# Patient Record
Sex: Female | Born: 1990 | Race: White | Hispanic: No | Marital: Married | Smoking: Never smoker
Health system: Southern US, Community
[De-identification: ages and names within clinical notes are randomized; demographics above are authoritative.]

## PROBLEM LIST (undated history)

## (undated) DIAGNOSIS — O24419 Gestational diabetes mellitus in pregnancy, unspecified control: Secondary | ICD-10-CM

## (undated) DIAGNOSIS — E282 Polycystic ovarian syndrome: Secondary | ICD-10-CM

## (undated) DIAGNOSIS — K760 Fatty (change of) liver, not elsewhere classified: Secondary | ICD-10-CM

## (undated) DIAGNOSIS — I10 Essential (primary) hypertension: Secondary | ICD-10-CM

## (undated) DIAGNOSIS — E119 Type 2 diabetes mellitus without complications: Secondary | ICD-10-CM

---
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
## 2013-04-24 HISTORY — PX: CHOLECYSTECTOMY: SHX55

## 2017-04-24 DIAGNOSIS — I10 Essential (primary) hypertension: Secondary | ICD-10-CM

## 2017-04-24 DIAGNOSIS — E88819 Insulin resistance, unspecified: Secondary | ICD-10-CM | POA: Insufficient documentation

## 2017-04-24 DIAGNOSIS — E8881 Metabolic syndrome: Secondary | ICD-10-CM | POA: Insufficient documentation

## 2017-04-24 DIAGNOSIS — Z8759 Personal history of other complications of pregnancy, childbirth and the puerperium: Secondary | ICD-10-CM | POA: Insufficient documentation

## 2017-04-24 DIAGNOSIS — E282 Polycystic ovarian syndrome: Secondary | ICD-10-CM | POA: Insufficient documentation

## 2017-04-24 HISTORY — DX: Essential (primary) hypertension: I10

## 2017-09-25 DIAGNOSIS — R748 Abnormal levels of other serum enzymes: Secondary | ICD-10-CM | POA: Insufficient documentation

## 2017-11-20 DIAGNOSIS — K760 Fatty (change of) liver, not elsewhere classified: Secondary | ICD-10-CM | POA: Insufficient documentation

## 2018-02-18 DIAGNOSIS — O24410 Gestational diabetes mellitus in pregnancy, diet controlled: Secondary | ICD-10-CM

## 2018-02-18 HISTORY — DX: Gestational diabetes mellitus in pregnancy, diet controlled: O24.410

## 2018-02-25 DIAGNOSIS — O1003 Pre-existing essential hypertension complicating the puerperium: Secondary | ICD-10-CM | POA: Insufficient documentation

## 2018-02-25 HISTORY — DX: Pre-existing essential hypertension complicating the puerperium: O10.03

## 2018-03-19 DIAGNOSIS — Z8632 Personal history of gestational diabetes: Secondary | ICD-10-CM | POA: Insufficient documentation

## 2019-02-20 DIAGNOSIS — F419 Anxiety disorder, unspecified: Secondary | ICD-10-CM | POA: Insufficient documentation

## 2020-05-11 ENCOUNTER — Inpatient Hospital Stay (HOSPITAL_COMMUNITY)
Admission: AD | Admit: 2020-05-11 | Discharge: 2020-05-11 | Disposition: A | Discharge status: Home / Self Care | Payer: 59 | Attending: Obstetrics and Gynecology | Admitting: Obstetrics and Gynecology

## 2020-05-11 ENCOUNTER — Encounter (HOSPITAL_COMMUNITY): Payer: Self-pay | Admitting: Obstetrics and Gynecology

## 2020-05-11 ENCOUNTER — Inpatient Hospital Stay (HOSPITAL_COMMUNITY): Payer: 59

## 2020-05-11 DIAGNOSIS — R079 Chest pain, unspecified: Secondary | ICD-10-CM

## 2020-05-11 DIAGNOSIS — O26612 Liver and biliary tract disorders in pregnancy, second trimester: Secondary | ICD-10-CM | POA: Diagnosis not present

## 2020-05-11 DIAGNOSIS — Z3A17 17 weeks gestation of pregnancy: Secondary | ICD-10-CM

## 2020-05-11 DIAGNOSIS — O98512 Other viral diseases complicating pregnancy, second trimester: Secondary | ICD-10-CM | POA: Diagnosis not present

## 2020-05-11 DIAGNOSIS — U071 COVID-19: Secondary | ICD-10-CM | POA: Diagnosis not present

## 2020-05-11 DIAGNOSIS — R091 Pleurisy: Secondary | ICD-10-CM | POA: Diagnosis present

## 2020-05-11 DIAGNOSIS — K760 Fatty (change of) liver, not elsewhere classified: Secondary | ICD-10-CM | POA: Diagnosis not present

## 2020-05-11 DIAGNOSIS — O10012 Pre-existing essential hypertension complicating pregnancy, second trimester: Secondary | ICD-10-CM | POA: Insufficient documentation

## 2020-05-11 HISTORY — DX: Fatty (change of) liver, not elsewhere classified: K76.0

## 2020-05-11 HISTORY — DX: Polycystic ovarian syndrome: E28.2

## 2020-05-11 LAB — CBC WITH DIFFERENTIAL/PLATELET
Abs Immature Granulocytes: 0.02 10*3/uL (ref 0.00–0.07)
Basophils Absolute: 0 10*3/uL (ref 0.0–0.1)
Basophils Relative: 1 %
Eosinophils Absolute: 0.3 10*3/uL (ref 0.0–0.5)
Eosinophils Relative: 5 %
HCT: 37.4 % (ref 36.0–46.0)
Hemoglobin: 12.7 g/dL (ref 12.0–15.0)
Immature Granulocytes: 0 %
Lymphocytes Relative: 26 %
Lymphs Abs: 1.8 10*3/uL (ref 0.7–4.0)
MCH: 32.2 pg (ref 26.0–34.0)
MCHC: 34 g/dL (ref 30.0–36.0)
MCV: 94.7 fL (ref 80.0–100.0)
Monocytes Absolute: 0.7 10*3/uL (ref 0.1–1.0)
Monocytes Relative: 11 %
Neutro Abs: 4 10*3/uL (ref 1.7–7.7)
Neutrophils Relative %: 57 %
Platelets: 163 10*3/uL (ref 150–400)
RBC: 3.95 MIL/uL (ref 3.87–5.11)
RDW: 13.3 % (ref 11.5–15.5)
WBC: 6.9 10*3/uL (ref 4.0–10.5)
nRBC: 0 % (ref 0.0–0.2)

## 2020-05-11 LAB — URINALYSIS, ROUTINE W REFLEX MICROSCOPIC
Bilirubin Urine: NEGATIVE
Glucose, UA: NEGATIVE mg/dL
Hgb urine dipstick: NEGATIVE
Ketones, ur: NEGATIVE mg/dL
Leukocytes,Ua: NEGATIVE
Nitrite: NEGATIVE
Protein, ur: NEGATIVE mg/dL
Specific Gravity, Urine: 1.02 (ref 1.005–1.030)
pH: 6 (ref 5.0–8.0)

## 2020-05-11 LAB — COMPREHENSIVE METABOLIC PANEL
ALT: 42 U/L (ref 0–44)
AST: 27 U/L (ref 15–41)
Albumin: 3.1 g/dL — ABNORMAL LOW (ref 3.5–5.0)
Alkaline Phosphatase: 62 U/L (ref 38–126)
Anion gap: 11 (ref 5–15)
BUN: 6 mg/dL (ref 6–20)
CO2: 22 mmol/L (ref 22–32)
Calcium: 9 mg/dL (ref 8.9–10.3)
Chloride: 105 mmol/L (ref 98–111)
Creatinine, Ser: 0.48 mg/dL (ref 0.44–1.00)
GFR, Estimated: >60 mL/min (ref >60)
Glucose, Bld: 102 mg/dL — ABNORMAL HIGH (ref 70–99)
Potassium: 3.8 mmol/L (ref 3.5–5.1)
Sodium: 138 mmol/L (ref 135–145)
Total Bilirubin: 0.5 mg/dL (ref 0.3–1.2)
Total Protein: 5.9 g/dL — ABNORMAL LOW (ref 6.5–8.1)

## 2020-05-11 LAB — RESP PANEL BY RT-PCR (FLU A&B, COVID) ARPGX2
Influenza A by PCR: NEGATIVE
Influenza B by PCR: NEGATIVE
SARS Coronavirus 2 by RT PCR: POSITIVE — AB

## 2020-05-11 MED ORDER — BENZONATATE 100 MG PO CAPS: 200.0000 mg | ORAL_CAPSULE | Freq: Three times a day (TID) | ORAL | 0 refills | Status: DC | PRN

## 2020-05-11 NOTE — MAU Note (Addendum)
Patient states she has been feeling bad since last week (congestion, body aches, chills, fevers & cough & chest pain associated w/ movement and talking).  Patient had a positive home Covid test but the OB told her they thought it could be Flu.  Reports she has a history of double pneumonia in 2020 when she was positive for Covid.  Patient denies any known Covid/Flu contacts.  Denies any VB/LOF.  Patient states her due date is 10/17/20 based off U/S in the office.

## 2020-05-11 NOTE — Discharge Instructions (Signed)
COVID-19: What Your Test Results Mean If you test positive for COVID-19 Take steps to protect others regardless of your COVID-19 vaccination status Stay home.  Isolate at home for at least 10 days. Stay in a specific room and away from other people in your home. Get rest and stay hydrated. If you develop symptoms, continue to isolate for at least 10 days after symptoms began and until you do not have a fever without using medications to reduce fever. Stay in touch with your doctor. Contact your doctor as soon as possible if you are an older adult or have underlying medical conditions. Contact your doctor or health department about isolation if you  Are severely ill or have a weakened immune system.  Had a positive test result followed by a negative result.  Test positive for many weeks. If you test negative for COVID-19:  The virus was not detected. If you have symptoms of COVID-19:  You may have received a false negative test result and still might have COVID-19.  Isolate from others. If you do not have symptoms of COVID-19 and you were exposed to a person with COVID-19:  You are likely not infected, but you still may get sick.  Contact your doctor about your symptoms, about follow-up testing, and how long to isolate.  Self-quarantine for 14 days at home after your exposure.  If you are fully vaccinated, you do not need to self quarantine.  Contact your doctor or local health department regarding options to reduce the length of your quarantine. A negative test result does not mean you won't get sick later. [email or web address] 01/20/2020 This information is not intended to replace advice given to you by your health care provider. Make sure you discuss any questions you have with your health care provider. Document Revised: 02/23/2020 Document Reviewed: 02/23/2020 Elsevier Patient Education  [place] Medications in Pregnancy   Acne:  Benzoyl Peroxide   Salicylic Acid   Backache/Headache:  Tylenol: 2 regular strength every 4 hours OR        2 Extra strength every 6 hours   Colds/Coughs/Allergies:  Benadryl (alcohol free) 25 mg every 6 hours as needed  Breath right strips  Claritin  Cepacol throat lozenges  Chloraseptic throat spray  Cold-Eeze- up to three times per day  Cough drops, alcohol free  Flonase (by prescription only)  Guaifenesin  Mucinex  Robitussin DM (plain only, alcohol free)  Saline nasal spray/drops  Sudafed (pseudoephedrine) & Actifed * use only after [redacted] weeks gestation and if you do not have high blood pressure  Tylenol  Vicks Vaporub  Zinc lozenges  Zyrtec   Constipation:  Colace  Ducolax suppositories  Fleet enema  Glycerin suppositories  Metamucil  Milk of magnesia  Miralax  Senokot  Smooth move tea   Diarrhea:  Kaopectate  Imodium A-D   *NO pepto Bismol   Hemorrhoids:  Anusol  Anusol HC  Preparation H  Tucks   Indigestion:  Tums  Maalox  Mylanta  Zantac  Pepcid   Insomnia:  Benadryl (alcohol free) 25mg  every 6 hours as needed  Tylenol PM  Unisom, no Gelcaps   Leg Cramps:  Tums  MagGel   Nausea/Vomiting:  Bonine  Dramamine  Emetrol  Ginger extract  Sea bands  Meclizine  Nausea medication to take during pregnancy:  Unisom (doxylamine succinate 25 mg tablets) Take one tablet daily at bedtime. If symptoms are not adequately controlled, the dose can be increased to a maximum recommended dose  of two tablets daily (1/2 tablet in the morning, 1/2 tablet mid-afternoon and one at bedtime).  Vitamin B6 100mg  tablets. Take one tablet twice a day (up to 200 mg per day).   Skin Rashes:  Aveeno products  Benadryl cream or 25mg  every 6 hours as needed  Calamine Lotion  1% cortisone cream   Yeast infection:  Gyne-lotrimin 7  Monistat 7    **If taking multiple medications, please check labels to avoid duplicating the same active ingredients  **take medication as  directed on the label  ** Do not exceed 4000 mg of tylenol in 24 hours  **Do not take medications that contain aspirin or ibuprofen

## 2020-05-11 NOTE — MAU Provider Note (Signed)
History     CSN: [id number]  Arrival date and time: 05/11/20 1745   Event Date/Time   First Provider Initiated Contact with Patient 05/11/20 1857      Chief Complaint  Patient presents with  . Pleurisy   HPI  [name] is a 30 y.o. female G3P1011 @ [redacted]w[redacted]d here in MAU with chest pain; sent here by [facility] for possible pneumonia. She reports 1 week ago she started having itchy ears, throat, nose with runny nose. The next day she took allergy medicine and the symptoms went away. Sunday (1/16) she had body aches, fatigue, chills and shivers. Monday she had worsening symptoms including cough and her home Covid test was positive. Today she reports a worsening cough and reports some chest pain with exertion and mobility.  She had Covid pneumonia in 2020 and is worried she is going to get sick again. She reports the pain is similar in her chest to that, however not has worse as it was previously with Covid.  She is taking Theraflu which helped her fever. She has taken some tylenol although limited due to her fatty liver.    She has not gotten her Covid vaccine and does not plan to get it.  CHTN: has not been able to keep down her procardia daily.   OB History    Gravida  3   Para  1   Term  1   Preterm      AB  1   Living  1     SAB  1   IAB      Ectopic      Multiple      Live Births  1           Past Medical History:  Diagnosis Date  . Fatty liver   . PCOS (polycystic ovarian syndrome)     Past Surgical History:  Procedure Laterality Date  . CHOLECYSTECTOMY  2015  . WISDOM TOOTH EXTRACTION  2011    History reviewed. No pertinent family history.  Social History   Tobacco Use  . Smoking status: Never Smoker  . Smokeless tobacco: Never Used  Vaping Use  . Vaping Use: Never used  Substance Use Topics  . Alcohol use: Never  . Drug use: Never    Allergies: No Known Allergies  Medications Prior to Admission  Medication Sig Dispense Refill Last Dose   . metFORMIN (GLUCOPHAGE-XR) 750 MG 24 hr tablet Take 750 mg by mouth daily with breakfast.   Past Week at Unknown time  . NIFEdipine (ADALAT CC) 60 MG 24 hr tablet Take 60 mg by mouth daily.   Past Week at Unknown time  . zinc gluconate 50 MG tablet Take 50 mg by mouth daily.   05/11/2020 at Unknown time   Recent Results (from the past 2160 hour(s))  Resp Panel by RT-PCR (Flu A&B, Covid) Nasopharyngeal Swab     Status: Abnormal   Collection Time: 05/11/20  6:34 PM   Specimen: Nasopharyngeal Swab; Nasopharyngeal(NP) swabs in vial transport medium  Result Value Ref Range   SARS Coronavirus 2 by RT PCR POSITIVE (A) NEGATIVE    Comment: RESULT CALLED TO, READ BACK BY AND VERIFIED WITHTonna Corner RN 2045 05/11/20 A BROWNING (NOTE) SARS-CoV-2 target nucleic acids are DETECTED.  The SARS-CoV-2 RNA is generally detectable in upper respiratory specimens during the acute phase of infection. Positive results are indicative of the presence of the identified virus, but do not rule out bacterial infection or  co-infection with other pathogens not detected by the test. Clinical correlation with patient history and other diagnostic information is necessary to determine patient infection status. The expected result is Negative.  Fact Sheet for Patients: EntrepreneurPulse.com.au  Fact Sheet for Healthcare Providers: IncredibleEmployment.be  This test is not yet approved or cleared by the [place] FDA and  has been authorized for detection and/or diagnosis of SARS-CoV-2 by FDA under an Emergency Use Authorization (EUA).  This EUA will remain in effect (meaning this test can  be used) for the duration of  the COVID-19 declaration under Section 564(b)(1) of the Act, 21 U.S.C. section 360bbb-3(b)(1), unless the authorization is terminated or revoked sooner.     Influenza A by PCR NEGATIVE NEGATIVE   Influenza B by PCR NEGATIVE NEGATIVE    Comment: (NOTE) The  Xpert Xpress SARS-CoV-2/FLU/RSV plus assay is intended as an aid in the diagnosis of influenza from Nasopharyngeal swab specimens and should not be used as a sole basis for treatment. Nasal washings and aspirates are unacceptable for Xpert Xpress SARS-CoV-2/FLU/RSV testing.  Fact Sheet for Patients: EntrepreneurPulse.com.au  Fact Sheet for Healthcare Providers: IncredibleEmployment.be  This test is not yet approved or cleared by the [place] FDA and has been authorized for detection and/or diagnosis of SARS-CoV-2 by FDA under an Emergency Use Authorization (EUA). This EUA will remain in effect (meaning this test can be used) for the duration of the COVID-19 declaration under Section 564(b)(1) of the Act, 21 U.S.C. section 360bbb-3(b)(1), unless the authorization is terminated or revoked.  Performed at [facility], [place] [address] [place], [place] [address]   Urinalysis, Routine w reflex microscopic Urine, Clean Catch     Status: None   Collection Time: 05/11/20  6:45 PM  Result Value Ref Range   Color, Urine YELLOW YELLOW   APPearance CLEAR CLEAR   Specific Gravity, Urine 1.020 1.005 - 1.030   pH 6.0 5.0 - 8.0   Glucose, UA NEGATIVE NEGATIVE mg/dL   Hgb urine dipstick NEGATIVE NEGATIVE   Bilirubin Urine NEGATIVE NEGATIVE   Ketones, ur NEGATIVE NEGATIVE mg/dL   Protein, ur NEGATIVE NEGATIVE mg/dL   Nitrite NEGATIVE NEGATIVE   Leukocytes,Ua NEGATIVE NEGATIVE    Comment: Performed at [facility] [address] [place],  [address]  CBC with Differential/Platelet     Status: None   Collection Time: 05/11/20  7:52 PM  Result Value Ref Range   WBC 6.9 4.0 - 10.5 K/uL   RBC 3.95 3.87 - 5.11 MIL/uL   Hemoglobin 12.7 12.0 - 15.0 g/dL   HCT 37.4 36.0 - 46.0 %   MCV 94.7 80.0 - 100.0 fL   MCH 32.2 26.0 - 34.0 pg   MCHC 34.0 30.0 - 36.0 g/dL   RDW 13.3 11.5 - 15.5 %   Platelets 163 150 - 400 K/uL   nRBC 0.0 0.0 - 0.2  %   Neutrophils Relative % 57 %   Neutro Abs 4.0 1.7 - 7.7 K/uL   Lymphocytes Relative 26 %   Lymphs Abs 1.8 0.7 - 4.0 K/uL   Monocytes Relative 11 %   Monocytes Absolute 0.7 0.1 - 1.0 K/uL   Eosinophils Relative 5 %   Eosinophils Absolute 0.3 0.0 - 0.5 K/uL   Basophils Relative 1 %   Basophils Absolute 0.0 0.0 - 0.1 K/uL   Immature Granulocytes 0 %   Abs Immature Granulocytes 0.02 0.00 - 0.07 K/uL    Comment: Performed at [facility],  [address], [place], [place] [address]   DG Chest 1 View  Result Date: 05/11/2020 CLINICAL DATA:  Chest pain EXAM: CHEST  1 VIEW COMPARISON:  None. FINDINGS: The heart size and mediastinal contours are within normal limits. Both lungs are clear. The visualized skeletal structures are unremarkable. IMPRESSION: No active disease. Electronically Signed   By: Prudencio Pair M.D.   On: 05/11/2020 20:01   Review of Systems  Constitutional: Negative for fever.  Respiratory: Negative for shortness of breath.   Cardiovascular: Positive for chest pain (with movement only, or coughing ). Negative for leg swelling.  Musculoskeletal: Positive for back pain. Negative for myalgias.  Neurological: Negative for headaches.   Physical Exam   Blood pressure (!) 145/86, pulse (!) 110, temperature 98.5 F (36.9 C), resp. rate 19, weight 115.9 kg, SpO2 98 %.  Physical Exam Constitutional:      General: She is not in acute distress.    Appearance: Normal appearance. She is obese. She is not ill-appearing, toxic-appearing or diaphoretic.  HENT:     Head: Normocephalic.  Cardiovascular:     Rate and Rhythm: Regular rhythm. Tachycardia present.     Pulses: Normal pulses.  Pulmonary:     Effort: Pulmonary effort is normal. No respiratory distress.     Breath sounds: Normal breath sounds. No wheezing.  Chest:     Chest wall: No tenderness.  Skin:    General: Skin is warm.  Neurological:     Mental Status: She is alert and oriented to person, place, and  time.  Psychiatric:        Behavior: Behavior normal.    MAU Course  Procedures  MDM  + fetal heart tones via doppler Patient tolerating oral fluids in MAU- denies nausea medication. EKG: normal sinus rhythm Chest Xray- WNL UA without signs of dehydration  Discussed chest xray and labs in detail with the patient; she appears well without distress. A message was sent to the Covid infusion center- patient was informed she may get a call and may be offered MAB infusion Reviewed case with Dr. Dione Plover.   Assessment and Plan   A:  1. COVID-19 affecting pregnancy in second trimester   2. Chest pain   3. [redacted] weeks gestation of pregnancy     P:  Discharge home in stable condition List of safe medications provided to patient Rx: Tessalon Perles Return to MAU with worsening chest pain or SOB Oral hydration is important  Start OTC Flonase Ok to use tylenol as directed on the bottle.   Lezlie Lye, NP 05/11/2020 9:31 PM

## 2020-05-28 ENCOUNTER — Emergency Department (HOSPITAL_BASED_OUTPATIENT_CLINIC_OR_DEPARTMENT_OTHER)
Admission: EM | Admit: 2020-05-28 | Discharge: 2020-05-29 | Disposition: A | Discharge status: Home / Self Care | Payer: 59 | Attending: Emergency Medicine | Admitting: Emergency Medicine

## 2020-05-28 ENCOUNTER — Other Ambulatory Visit: Payer: Self-pay

## 2020-05-28 ENCOUNTER — Encounter (HOSPITAL_BASED_OUTPATIENT_CLINIC_OR_DEPARTMENT_OTHER): Payer: Self-pay | Admitting: *Deleted

## 2020-05-28 DIAGNOSIS — R109 Unspecified abdominal pain: Secondary | ICD-10-CM | POA: Insufficient documentation

## 2020-05-28 DIAGNOSIS — Z3A20 20 weeks gestation of pregnancy: Secondary | ICD-10-CM | POA: Diagnosis not present

## 2020-05-28 DIAGNOSIS — O26892 Other specified pregnancy related conditions, second trimester: Secondary | ICD-10-CM

## 2020-05-28 LAB — CBC WITH DIFFERENTIAL/PLATELET
Abs Immature Granulocytes: 0.04 10*3/uL (ref 0.00–0.07)
Basophils Absolute: 0 10*3/uL (ref 0.0–0.1)
Basophils Relative: 0 %
Eosinophils Absolute: 0.2 10*3/uL (ref 0.0–0.5)
Eosinophils Relative: 2 %
HCT: 37.5 % (ref 36.0–46.0)
Hemoglobin: 12.9 g/dL (ref 12.0–15.0)
Immature Granulocytes: 0 %
Lymphocytes Relative: 27 %
Lymphs Abs: 2.5 10*3/uL (ref 0.7–4.0)
MCH: 32.5 pg (ref 26.0–34.0)
MCHC: 34.4 g/dL (ref 30.0–36.0)
MCV: 94.5 fL (ref 80.0–100.0)
Monocytes Absolute: 0.7 10*3/uL (ref 0.1–1.0)
Monocytes Relative: 7 %
Neutro Abs: 5.9 10*3/uL (ref 1.7–7.7)
Neutrophils Relative %: 64 %
Platelets: 186 10*3/uL (ref 150–400)
RBC: 3.97 MIL/uL (ref 3.87–5.11)
RDW: 13.8 % (ref 11.5–15.5)
WBC: 9.3 10*3/uL (ref 4.0–10.5)
nRBC: 0 % (ref 0.0–0.2)

## 2020-05-28 LAB — URINALYSIS, MICROSCOPIC (REFLEX)

## 2020-05-28 LAB — URINALYSIS, ROUTINE W REFLEX MICROSCOPIC
Bilirubin Urine: NEGATIVE
Glucose, UA: NEGATIVE mg/dL
Hgb urine dipstick: NEGATIVE
Leukocytes,Ua: NEGATIVE
Nitrite: NEGATIVE
Specific Gravity, Urine: 1.03 (ref 1.005–1.030)
pH: 6 (ref 5.0–8.0)

## 2020-05-28 LAB — COMPREHENSIVE METABOLIC PANEL
ALT: 33 U/L (ref 0–44)
AST: 22 U/L (ref 15–41)
Albumin: 3.3 g/dL — ABNORMAL LOW (ref 3.5–5.0)
Alkaline Phosphatase: 61 U/L (ref 38–126)
Anion gap: 12 (ref 5–15)
BUN: 10 mg/dL (ref 6–20)
CO2: 21 mmol/L — ABNORMAL LOW (ref 22–32)
Calcium: 9.4 mg/dL (ref 8.9–10.3)
Chloride: 104 mmol/L (ref 98–111)
Creatinine, Ser: 0.59 mg/dL (ref 0.44–1.00)
GFR, Estimated: >60 mL/min (ref >60)
Glucose, Bld: 97 mg/dL (ref 70–99)
Potassium: 3.7 mmol/L (ref 3.5–5.1)
Sodium: 137 mmol/L (ref 135–145)
Total Bilirubin: 0.4 mg/dL (ref 0.3–1.2)
Total Protein: 6.9 g/dL (ref 6.5–8.1)

## 2020-05-28 MED ORDER — MORPHINE SULFATE (PF) 4 MG/ML IV SOLN
4.0000 mg | Freq: Once | INTRAVENOUS | Status: AC
  Administered 2020-05-28: 4 mg via INTRAVENOUS
  Filled 2020-05-28: qty 1

## 2020-05-28 MED ORDER — ONDANSETRON HCL 4 MG/2ML IJ SOLN
4.0000 mg | Freq: Once | INTRAMUSCULAR | Status: AC
  Administered 2020-05-28: 4 mg via INTRAVENOUS
  Filled 2020-05-28: qty 2

## 2020-05-28 NOTE — ED Provider Notes (Signed)
[facility] Provider Note   CSN: [id number] Arrival date & time: 05/28/20  1935     History Chief Complaint  Patient presents with  . Abdominal Pain    Sheryl Velasquez is a 30 y.o. female.  Patient presents emergency department for evaluation of right sided abdominal pain.  She has been having pain for about a week it was intermittent and more mild.  She thought that she had round ligament pain.  She is approximately [redacted] weeks pregnant.  She spoke with her OB/GYN office and they recommended coming in for evaluation if she had pain in her back.  Pain became more severe tonight.  She has been having vomiting with this severe pain.  No hematuria or dysuria.  She denies any change in vaginal discharge or vaginal bleeding.  No fevers, chest pain, shortness of breath.        Past Medical History:  Diagnosis Date  . Fatty liver   . PCOS (polycystic ovarian syndrome)     There are no problems to display for this patient.   Past Surgical History:  Procedure Laterality Date  . CHOLECYSTECTOMY  2015  . WISDOM TOOTH EXTRACTION  2011     OB History    Gravida  3   Para  1   Term  1   Preterm      AB  1   Living  1     SAB  1   IAB      Ectopic      Multiple      Live Births  1           No family history on file.  Social History   Tobacco Use  . Smoking status: Never Smoker  . Smokeless tobacco: Never Used  Vaping Use  . Vaping Use: Never used  Substance Use Topics  . Alcohol use: Never  . Drug use: Never    Home Medications Prior to Admission medications   Medication Sig Start Date End Date Taking? Authorizing Provider  metFORMIN (GLUCOPHAGE-XR) 750 MG 24 hr tablet Take 750 mg by mouth daily with breakfast.   Yes [provider]  NIFEdipine (ADALAT CC) 60 MG 24 hr tablet Take 60 mg by mouth daily.   Yes [provider]  zinc gluconate 50 MG tablet Take 50 mg by mouth daily.   Yes [provider]  benzonatate (TESSALON PERLES) 100 MG capsule Take 2 capsules (200 mg total) by mouth 3 (three) times daily as needed for cough. 05/11/20 05/11/21  Rasch, Artist Pais, NP    Allergies    Patient has no known allergies.  Review of Systems   Review of Systems  Constitutional: Negative for fever.  HENT: Negative for rhinorrhea and sore throat.   Eyes: Negative for redness.  Respiratory: Negative for cough.   Cardiovascular: Negative for chest pain.  Gastrointestinal: Positive for abdominal pain, nausea and vomiting. Negative for diarrhea.  Genitourinary: Negative for dysuria, frequency, hematuria, urgency, vaginal bleeding and vaginal discharge.  Musculoskeletal: Negative for myalgias.  Skin: Negative for rash.  Neurological: Negative for headaches.    Physical Exam Updated Vital Signs BP 117/69 (BP Location: Right Arm)   Pulse 95   Temp 98.3 F (36.8 C) (Oral)   Resp 18   Ht 5\' 2"  (1.575 m)   Wt 116.1 kg   SpO2 100%   BMI 46.82 kg/m   Physical Exam Vitals and nursing note reviewed.  Constitutional:  General: She is in acute distress.     Appearance: She is well-developed.     Comments: Patient actively heaving and vomiting.  HENT:     Head: Normocephalic and atraumatic.     Right Ear: External ear normal.     Left Ear: External ear normal.     Nose: Nose normal.  Eyes:     Conjunctiva/sclera: Conjunctivae normal.  Cardiovascular:     Rate and Rhythm: Normal rate and regular rhythm.     Heart sounds: No murmur heard.   Pulmonary:     Effort: No respiratory distress.     Breath sounds: No wheezing, rhonchi or rales.  Abdominal:     Palpations: Abdomen is soft.     Tenderness: There is no abdominal tenderness (Abdomen exam performed after administration of medications, pain resolved). There is no guarding or rebound.  Musculoskeletal:     Cervical back: Normal range of motion and neck supple.     Right lower leg: No edema.     Left lower leg: No  edema.  Skin:    General: Skin is warm and dry.     Findings: No rash.  Neurological:     General: No focal deficit present.     Mental Status: She is alert. Mental status is at baseline.     Motor: No weakness.  Psychiatric:        Mood and Affect: Mood normal.     ED Results / Procedures / Treatments   Labs (all labs ordered are listed, but only abnormal results are displayed) Labs Reviewed  URINALYSIS, ROUTINE W REFLEX MICROSCOPIC - Abnormal; Notable for the following components:      Result Value   APPearance HAZY (*)    Ketones, ur TRACE (*)    Protein, ur TRACE (*)    All other components within normal limits  URINALYSIS, MICROSCOPIC (REFLEX) - Abnormal; Notable for the following components:   Bacteria, UA MANY (*)    All other components within normal limits  COMPREHENSIVE METABOLIC PANEL - Abnormal; Notable for the following components:   CO2 21 (*)    Albumin 3.3 (*)    All other components within normal limits  CBC WITH DIFFERENTIAL/PLATELET    EKG None  Radiology No results found.  Procedures Procedures   Medications Ordered in ED Medications  morphine 4 MG/ML injection 4 mg (4 mg Intravenous Given 05/28/20 2126)  ondansetron (ZOFRAN) injection 4 mg (4 mg Intravenous Given 05/28/20 2124)    ED Course  I have reviewed the triage vital signs and the nursing notes.  Pertinent labs & imaging results that were available during my care of the patient were reviewed by me and considered in my medical decision making (see chart for details).  Upon entering the room, patient is actively vomiting in bed, on all fours.  Most of history from patient's husband, but she denies current vaginal bleeding or discharge.  Pain is right lower quadrant and back.  She is going to need medication for pain if she appears extremely uncomfortable.  Will give morphine and Zofran to try to control symptoms in order to get a good exam.  Vital signs reviewed and are as follows: BP  117/69 (BP Location: Right Arm)   Pulse 95   Temp 98.3 F (36.8 C) (Oral)   Resp 18   Ht 5\' 2"  (1.575 m)   Wt 116.1 kg   SpO2 100%   BMI 46.82 kg/m   Patient rechecked after administration  of morphine and Zofran.  Her exam is entirely different.  She is in no distress and appears very comfortable.  Her pain has completely resolved.  Her abdomen is soft and nontender on exam.  Again, she denies any back pain or vaginal bleeding or discharge.  I used the bedside ultrasound to ensure good fetal movement and normal fetal heart rate.  Heart rate is around 155.  She can feel her baby moving and I see good fetal movement on the ultrasound.  Discussed case with Dr. Karle Starch.  Discussed how to proceed with patient at bedside.  At this point she is comfortable with discharged home.  She will monitor her symptoms closely.  Current plan is that if her symptoms recur or worsen, she is to go to the MAU at [facility].  Otherwise, she will call her OB/GYN tomorrow morning for further instructions.  If she develops worsening or severe abdominal pain, back pain, vaginal bleeding or discharge, she is to seek immediate medical attention.  She is willing to do this and is comfortable with this.  Husband at bedside also in agreement with plan.  Patient continues to look comfortable prior to discharge.   MDM Rules/Calculators/A&P                          Patient with intermittent abdominal pains, becoming severe tonight.  She is in the second trimester pregnancy.  No vaginal bleeding or discharge.  No back pain.  She was acting as though she had a kidney stone on the right side, however no blood noted in urine.  This is still a possibility.  Given her remarkable improvement in symptoms, do not feel that she requires emergent work-up at this time however will need close follow-up with her OB/GYN.  If her symptoms recur or worsen, she is willing to go to the [facility] hospital for further evaluation.   Final  Clinical Impression(s) / ED Diagnoses Final diagnoses:  Abdominal pain during pregnancy in second trimester    Rx / DC Orders ED Discharge Orders    None       Carlisle Cater, PA-C 05/28/20 2349    Truddie Hidden, MD 05/29/20 [id number]

## 2020-05-28 NOTE — ED Triage Notes (Signed)
[redacted] weeks pregnant. She has been having right lower quadrant pain for over a week. Tonight the pain moves into her flank.

## 2020-05-28 NOTE — Discharge Instructions (Signed)
Please read and follow all provided instructions.  Your diagnoses today include:  1. Abdominal pain during pregnancy in second trimester     Tests performed today include:  Blood cell counts and platelets  Kidney and liver function tests  Pancreas function test (called lipase)  Urine test to look for infection  Vital signs. See below for your results today.   Medications prescribed:   None  Take any prescribed medications only as directed.  Home care instructions:   Follow any educational materials contained in this packet.  Follow-up instructions: Call your OB/GYN tomorrow for further instructions.  As we discussed if your pain returns and worsens, [facility].  You should also go if you develop vaginal bleeding or significant discharge or fluid leakage.  Return instructions:  SEEK IMMEDIATE MEDICAL ATTENTION IF:  The pain does not go away or becomes severe   A temperature above 101F develops   Repeated vomiting occurs (multiple episodes)   The pain becomes localized to portions of the abdomen. The right side could possibly be appendicitis. In an adult, the left lower portion of the abdomen could be colitis or diverticulitis.   Blood is being passed in stools or vomit (bright red or black tarry stools)   You develop chest pain, difficulty breathing, dizziness or fainting, or become confused, poorly responsive, or inconsolable (young children)  If you have any other emergent concerns regarding your health  Additional Information: Abdominal (belly) pain can be caused by many things. Your caregiver performed an examination and possibly ordered blood/urine tests and imaging (CT scan, x-rays, ultrasound). Many cases can be observed and treated at home after initial evaluation in the emergency department. Even though you are being discharged home, abdominal pain can be unpredictable. Therefore, you need a repeated exam if your  pain does not resolve, returns, or worsens. Most patients with abdominal pain don't have to be admitted to the hospital or have surgery, but serious problems like appendicitis and gallbladder attacks can start out as nonspecific pain. Many abdominal conditions cannot be diagnosed in one visit, so follow-up evaluations are very important.  Your vital signs today were: BP 112/64 (BP Location: Right Arm)    Pulse 78    Temp 98.3 F (36.8 C) (Oral)    Resp 18    Ht 5\' 2"  (1.575 m)    Wt 116.1 kg    SpO2 98%    BMI 46.82 kg/m  If your blood pressure (bp) was elevated above 135/85 this visit, please have this repeated by your doctor within one month. --------------

## 2020-08-18 ENCOUNTER — Inpatient Hospital Stay (HOSPITAL_COMMUNITY)
Admission: AD | Admit: 2020-08-18 | Discharge: 2020-08-18 | Disposition: A | Discharge status: Home / Self Care | Payer: 59 | Attending: Obstetrics and Gynecology | Admitting: Obstetrics and Gynecology

## 2020-08-18 ENCOUNTER — Encounter (HOSPITAL_COMMUNITY): Payer: Self-pay | Admitting: Obstetrics and Gynecology

## 2020-08-18 ENCOUNTER — Inpatient Hospital Stay (HOSPITAL_COMMUNITY): Payer: 59

## 2020-08-18 ENCOUNTER — Other Ambulatory Visit: Payer: Self-pay

## 2020-08-18 DIAGNOSIS — Z3A31 31 weeks gestation of pregnancy: Secondary | ICD-10-CM | POA: Diagnosis not present

## 2020-08-18 DIAGNOSIS — N2889 Other specified disorders of kidney and ureter: Secondary | ICD-10-CM | POA: Diagnosis not present

## 2020-08-18 DIAGNOSIS — Z7984 Long term (current) use of oral hypoglycemic drugs: Secondary | ICD-10-CM | POA: Diagnosis not present

## 2020-08-18 DIAGNOSIS — N200 Calculus of kidney: Secondary | ICD-10-CM | POA: Insufficient documentation

## 2020-08-18 DIAGNOSIS — R109 Unspecified abdominal pain: Secondary | ICD-10-CM

## 2020-08-18 DIAGNOSIS — Z7982 Long term (current) use of aspirin: Secondary | ICD-10-CM | POA: Diagnosis not present

## 2020-08-18 DIAGNOSIS — O26833 Pregnancy related renal disease, third trimester: Secondary | ICD-10-CM

## 2020-08-18 DIAGNOSIS — M549 Dorsalgia, unspecified: Secondary | ICD-10-CM | POA: Diagnosis not present

## 2020-08-18 DIAGNOSIS — O99891 Other specified diseases and conditions complicating pregnancy: Secondary | ICD-10-CM | POA: Insufficient documentation

## 2020-08-18 HISTORY — DX: Essential (primary) hypertension: I10

## 2020-08-18 HISTORY — DX: Type 2 diabetes mellitus without complications: E11.9

## 2020-08-18 HISTORY — DX: Gestational diabetes mellitus in pregnancy, unspecified control: O24.419

## 2020-08-18 LAB — COMPREHENSIVE METABOLIC PANEL
ALT: 32 U/L (ref 0–44)
AST: 21 U/L (ref 15–41)
Albumin: 2.9 g/dL — ABNORMAL LOW (ref 3.5–5.0)
Alkaline Phosphatase: 73 U/L (ref 38–126)
Anion gap: 7 (ref 5–15)
BUN: 7 mg/dL (ref 6–20)
CO2: 24 mmol/L (ref 22–32)
Calcium: 8.8 mg/dL — ABNORMAL LOW (ref 8.9–10.3)
Chloride: 106 mmol/L (ref 98–111)
Creatinine, Ser: 0.53 mg/dL (ref 0.44–1.00)
GFR, Estimated: >60 mL/min (ref >60)
Glucose, Bld: 90 mg/dL (ref 70–99)
Potassium: 4 mmol/L (ref 3.5–5.1)
Sodium: 137 mmol/L (ref 135–145)
Total Bilirubin: 0.5 mg/dL (ref 0.3–1.2)
Total Protein: 6 g/dL — ABNORMAL LOW (ref 6.5–8.1)

## 2020-08-18 LAB — URINALYSIS, ROUTINE W REFLEX MICROSCOPIC
Bilirubin Urine: NEGATIVE
Glucose, UA: NEGATIVE mg/dL
Hgb urine dipstick: NEGATIVE
Ketones, ur: NEGATIVE mg/dL
Leukocytes,Ua: NEGATIVE
Nitrite: NEGATIVE
Protein, ur: NEGATIVE mg/dL
Specific Gravity, Urine: 1.017 (ref 1.005–1.030)
pH: 6 (ref 5.0–8.0)

## 2020-08-18 LAB — CBC WITH DIFFERENTIAL/PLATELET
Abs Immature Granulocytes: 0.03 10*3/uL (ref 0.00–0.07)
Basophils Absolute: 0 10*3/uL (ref 0.0–0.1)
Basophils Relative: 0 %
Eosinophils Absolute: 0.1 10*3/uL (ref 0.0–0.5)
Eosinophils Relative: 1 %
HCT: 36.4 % (ref 36.0–46.0)
Hemoglobin: 12.5 g/dL (ref 12.0–15.0)
Immature Granulocytes: 0 %
Lymphocytes Relative: 22 %
Lymphs Abs: 1.8 10*3/uL (ref 0.7–4.0)
MCH: 32.6 pg (ref 26.0–34.0)
MCHC: 34.3 g/dL (ref 30.0–36.0)
MCV: 94.8 fL (ref 80.0–100.0)
Monocytes Absolute: 0.5 10*3/uL (ref 0.1–1.0)
Monocytes Relative: 6 %
Neutro Abs: 5.9 10*3/uL (ref 1.7–7.7)
Neutrophils Relative %: 71 %
Platelets: 155 10*3/uL (ref 150–400)
RBC: 3.84 MIL/uL — ABNORMAL LOW (ref 3.87–5.11)
RDW: 13.6 % (ref 11.5–15.5)
WBC: 8.2 10*3/uL (ref 4.0–10.5)
nRBC: 0 % (ref 0.0–0.2)

## 2020-08-18 MED ORDER — TAMSULOSIN HCL 0.4 MG PO CAPS
0.4000 mg | ORAL_CAPSULE | Freq: Once | ORAL | Status: AC
  Administered 2020-08-18: 0.4 mg via ORAL
  Filled 2020-08-18: qty 1

## 2020-08-18 MED ORDER — TRAMADOL HCL 50 MG PO TABS: 50.0000 mg | ORAL_TABLET | Freq: Four times a day (QID) | ORAL | 0 refills | Status: DC | PRN

## 2020-08-18 MED ORDER — HYDROMORPHONE HCL 1 MG/ML IJ SOLN
1.0000 mg | Freq: Once | INTRAMUSCULAR | Status: AC
  Administered 2020-08-18: 1 mg via INTRAMUSCULAR
  Filled 2020-08-18: qty 1

## 2020-08-18 MED ORDER — HYDROMORPHONE HCL 1 MG/ML IJ SOLN: 1.0000 mg | Freq: Once | INTRAMUSCULAR | Status: DC

## 2020-08-18 NOTE — MAU Provider Note (Signed)
Chief Complaint:  Flank Pain   Event Date/Time   First Provider Initiated Contact with Patient 08/18/20 1059      HPI: Sheryl Velasquez is a 30 y.o. G3P1011 at [redacted]w[redacted]d with medical hx significant for renal calculi in current pregnancy who presents to maternity admissions reporting onset of right back and right flank pain this morning. The pain was most severe on the way to the hospital but has moved to her right lower abdomen and then subsided since arrival in MAU. She reports this is similar to her last kidney stone as it moved and then she passed the stone.  There was vomiting at the onset with pain but none now.  There are no other associated symptoms. She denies any cramping/contractions and reports good fetal movement.     Location: right flank Quality: dull Severity: 7/10 on pain scale then 0/10 at present Duration: 1-2 hours Timing: constant while present Modifying factors: none Associated signs and symptoms: vomiting  HPI  Past Medical History: Past Medical History:  Diagnosis Date  . Diabetes mellitus without complication ([place])   . Fatty liver   . Gestational diabetes   . Hypertension   . PCOS (polycystic ovarian syndrome)     Past obstetric history: OB History  Gravida Para Term Preterm AB Living  3 1 1   1 1   SAB IAB Ectopic Multiple Live Births  1       1    # Outcome Date GA Lbr Len/2nd Weight Sex Delivery Anes PTL Lv  3 Current           2 Term 2019     Vag-Spont   LIV  1 SAB 2018            Past Surgical History: Past Surgical History:  Procedure Laterality Date  . CHOLECYSTECTOMY  2015  . WISDOM TOOTH EXTRACTION  2011    Family History: Family History  Problem Relation Age of Onset  . Diabetes Mother   . Hypertension Mother   . Hypertension Father     Social History: Social History   Tobacco Use  . Smoking status: Never Smoker  . Smokeless tobacco: Never Used  Vaping Use  . Vaping Use: Never used  Substance Use Topics  . Alcohol use: Never   . Drug use: Never    Allergies: No Known Allergies  Meds:  Medications Prior to Admission  Medication Sig Dispense Refill Last Dose  . aspirin 81 MG chewable tablet Chew by mouth daily.   08/17/2020 at [place] [facility]  . metFORMIN (GLUCOPHAGE-XR) 750 MG 24 hr tablet Take 1,000 mg by mouth daily with supper.   08/17/2020 at [place] [facility]  . NIFEdipine (ADALAT CC) 60 MG 24 hr tablet Take 60 mg by mouth daily.   08/17/2020 at [facility]  . Prenatal Vit-Fe Fumarate-FA (MULTIVITAMIN-PRENATAL) 27-0.8 MG TABS tablet Take 1 tablet by mouth daily at 12 noon.   08/17/2020 at [place]  . benzonatate (TESSALON PERLES) 100 MG capsule Take 2 capsules (200 mg total) by mouth 3 (three) times daily as needed for cough. 30 capsule 0   . zinc gluconate 50 MG tablet Take 50 mg by mouth daily.       ROS:  Review of Systems  Constitutional: Negative for chills, fatigue and fever.  Eyes: Negative for visual disturbance.  Respiratory: Negative for shortness of breath.   Cardiovascular: Negative for chest pain.  Gastrointestinal: Positive for abdominal pain, nausea and vomiting.  Genitourinary: Negative for difficulty urinating, dysuria, flank pain, pelvic  pain, vaginal bleeding, vaginal discharge and vaginal pain.  Musculoskeletal: Positive for back pain.  Neurological: Negative for dizziness and headaches.  Psychiatric/Behavioral: Negative.      I have reviewed patient's Past Medical Hx, Surgical Hx, Family Hx, Social Hx, medications and allergies.   Physical Exam   Patient Vitals for the past 24 hrs:  BP Temp Temp src Pulse Resp SpO2  08/18/20 1423 -- 98 F (36.7 C) Oral 85 18 100 %  08/18/20 1053 139/74 97.8 F (36.6 C) Oral 84 18 --  08/18/20 1044 132/79 98.4 F (36.9 C) Oral 86 20 98 %   Constitutional: Well-developed, well-nourished female in no acute distress.  Cardiovascular: normal rate Respiratory: normal effort GI: Abd soft, non-tender, no rebound tenderness or guarding, gravid appropriate for gestational age.   MS: Extremities nontender, no edema, normal ROM Neurologic: Alert and oriented x 4.  GU: Neg CVAT.  PELVIC EXAM: Deferred     FHT:  Baseline 145 , moderate variability, accelerations present, no decelerations Contractions: None on toco or to palpation   Labs: Results for orders placed or performed during the hospital encounter of 08/18/20 (from the past 24 hour(s))  Urinalysis, Routine w reflex microscopic Urine, Clean Catch     Status: None   Collection Time: 08/18/20 10:28 AM  Result Value Ref Range   Color, Urine YELLOW YELLOW   APPearance CLEAR CLEAR   Specific Gravity, Urine 1.017 1.005 - 1.030   pH 6.0 5.0 - 8.0   Glucose, UA NEGATIVE NEGATIVE mg/dL   Hgb urine dipstick NEGATIVE NEGATIVE   Bilirubin Urine NEGATIVE NEGATIVE   Ketones, ur NEGATIVE NEGATIVE mg/dL   Protein, ur NEGATIVE NEGATIVE mg/dL   Nitrite NEGATIVE NEGATIVE   Leukocytes,Ua NEGATIVE NEGATIVE  CBC with Differential/Platelet     Status: Abnormal   Collection Time: 08/18/20 12:54 PM  Result Value Ref Range   WBC 8.2 4.0 - 10.5 K/uL   RBC 3.84 (L) 3.87 - 5.11 MIL/uL   Hemoglobin 12.5 12.0 - 15.0 g/dL   HCT 36.4 36.0 - 46.0 %   MCV 94.8 80.0 - 100.0 fL   MCH 32.6 26.0 - 34.0 pg   MCHC 34.3 30.0 - 36.0 g/dL   RDW 13.6 11.5 - 15.5 %   Platelets 155 150 - 400 K/uL   nRBC 0.0 0.0 - 0.2 %   Neutrophils Relative % 71 %   Neutro Abs 5.9 1.7 - 7.7 K/uL   Lymphocytes Relative 22 %   Lymphs Abs 1.8 0.7 - 4.0 K/uL   Monocytes Relative 6 %   Monocytes Absolute 0.5 0.1 - 1.0 K/uL   Eosinophils Relative 1 %   Eosinophils Absolute 0.1 0.0 - 0.5 K/uL   Basophils Relative 0 %   Basophils Absolute 0.0 0.0 - 0.1 K/uL   Immature Granulocytes 0 %   Abs Immature Granulocytes 0.03 0.00 - 0.07 K/uL  Comprehensive metabolic panel     Status: Abnormal   Collection Time: 08/18/20  1:15 PM  Result Value Ref Range   Sodium 137 135 - 145 mmol/L   Potassium 4.0 3.5 - 5.1 mmol/L   Chloride 106 98 - 111 mmol/L   CO2 24 22  - 32 mmol/L   Glucose, Bld 90 70 - 99 mg/dL   BUN 7 6 - 20 mg/dL   Creatinine, Ser 0.53 0.44 - 1.00 mg/dL   Calcium 8.8 (L) 8.9 - 10.3 mg/dL   Total Protein 6.0 (L) 6.5 - 8.1 g/dL   Albumin 2.9 (L) 3.5 -  5.0 g/dL   AST 21 15 - 41 U/L   ALT 32 0 - 44 U/L   Alkaline Phosphatase 73 38 - 126 U/L   Total Bilirubin 0.5 0.3 - 1.2 mg/dL   GFR, Estimated >60 >60 mL/min   Anion gap 7 5 - 15      Imaging:  US RENAL  Result Date: 08/18/2020 CLINICAL DATA:  Right flank pain. History of kidney stones. Thirty-one weeks pregnant. EXAM: RENAL / URINARY TRACT ULTRASOUND COMPLETE COMPARISON:  None. FINDINGS: Right Kidney: Renal measurements: 13.0 x 5.3 x 5.3 cm = volume: 194 mL. Normal echogenicity. No hydronephrosis. No visible stone. 1.2 cm echogenic focus in the upper pole that could be a small angiomyolipoma. Left Kidney: Renal measurements: 13.6 x 5.7 x 5.2 cm = volume: 211 mL. Normal echogenicity. No hydronephrosis. No visible stone. No mass. Bladder: Empty due to recent voiding. Other: None. IMPRESSION: No evidence of renal stone disease or hydronephrosis. 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 Electronically Signed   By: Nelson Chimes M.D.   On: 08/18/2020 12:46    MAU Course/MDM: Orders Placed This Encounter  Procedures  . US RENAL  . Urinalysis, Routine w reflex microscopic Urine, Clean Catch  . CBC with Differential/Platelet  . Comprehensive metabolic panel  . Discharge patient    Meds ordered this encounter  Medications  . tamsulosin (FLOMAX) capsule 0.4 mg  . DISCONTD: HYDROmorphone (DILAUDID) injection 1 mg  . HYDROmorphone (DILAUDID) injection 1 mg  . traMADol (ULTRAM) 50 MG tablet    Sig: Take 1-2 tablets (50-100 mg total) by mouth every 6 (six) hours as needed.    Dispense:  15 tablet    Refill:  0    Order Specific Question:   Supervising Provider    Answer:   Caren Macadam [[id number]]     NST reviewed and  appropriate for gestational age 55 pain in right lower back/right flank area but no hematuria today or on previous visit at [facility].  Renal US normal with benign mass on right kidney. Consult Nephrologist who recommends imaging in 6 months after the pregnancy. This is likely not the cause of pain per nephrology. Liver enzymes wnl.  No acute or emergent findings today.  D/C home with Tramadol Rx, f/u in OB office this week as scheduled. Return to MAU as needed for emergencies.    Assessment: 1. Back pain affecting pregnancy in third trimester   2. Right flank pain   3. Kidney stone complicating pregnancy, third trimester   4. Acute right flank pain   5. Right kidney mass   6. [redacted] weeks gestation of pregnancy     Plan: Discharge home Labor precautions and fetal kick counts  Follow-up Information    [facility] Follow up.   Why: Follow up this week as scheduled.  Return to MAU as needed for emergencies.  Contact information: [address]  [address] [place] [address] [phone]              Allergies as of 08/18/2020   No Known Allergies     Medication List    TAKE these medications   aspirin 81 MG chewable tablet Chew by mouth daily.   benzonatate 100 MG capsule Commonly known as: Tessalon Perles Take 2 capsules (200 mg total) by mouth 3 (three) times daily as needed for cough.   metFORMIN 750 MG 24 hr tablet  Commonly known as: GLUCOPHAGE-XR Take 1,000 mg by mouth daily with supper.   multivitamin-prenatal 27-0.8 MG Tabs tablet Take 1 tablet by mouth daily at 12 noon.   NIFEdipine 60 MG 24 hr tablet Commonly known as: ADALAT CC Take 60 mg by mouth daily.   traMADol 50 MG tablet Commonly known as: ULTRAM Take 1-2 tablets (50-100 mg total) by mouth every 6 (six) hours as needed.   zinc gluconate 50 MG tablet Take 50 mg by mouth daily.       Fatima Blank Certified Nurse-Midwife 08/18/2020 2:29 PM

## 2020-08-18 NOTE — MAU Note (Signed)
Presents with c/o right flank pain that began this morning, thinks has another kidney stone.  Denies dysuria or hematuria.  Reports has Hx of kidney stone earlier during the pregnancy.  Denies VB or LOF.  Endorses +FM.

## 2020-09-14 ENCOUNTER — Observation Stay (HOSPITAL_COMMUNITY)
Admission: AD | Admit: 2020-09-14 | Discharge: 2020-09-16 | Disposition: A | Discharge status: Home / Self Care | Payer: 59 | Attending: Obstetrics and Gynecology | Admitting: Obstetrics and Gynecology

## 2020-09-14 ENCOUNTER — Observation Stay (HOSPITAL_COMMUNITY): Payer: 59

## 2020-09-14 ENCOUNTER — Other Ambulatory Visit: Payer: Self-pay

## 2020-09-14 ENCOUNTER — Encounter (HOSPITAL_COMMUNITY): Payer: Self-pay | Admitting: Obstetrics and Gynecology

## 2020-09-14 ENCOUNTER — Inpatient Hospital Stay (HOSPITAL_COMMUNITY): Payer: 59

## 2020-09-14 DIAGNOSIS — Z3A35 35 weeks gestation of pregnancy: Secondary | ICD-10-CM | POA: Diagnosis not present

## 2020-09-14 DIAGNOSIS — O26899 Other specified pregnancy related conditions, unspecified trimester: Secondary | ICD-10-CM | POA: Diagnosis present

## 2020-09-14 DIAGNOSIS — Z7984 Long term (current) use of oral hypoglycemic drugs: Secondary | ICD-10-CM | POA: Insufficient documentation

## 2020-09-14 DIAGNOSIS — O34219 Maternal care for unspecified type scar from previous cesarean delivery: Secondary | ICD-10-CM

## 2020-09-14 DIAGNOSIS — Z20822 Contact with and (suspected) exposure to covid-19: Secondary | ICD-10-CM | POA: Insufficient documentation

## 2020-09-14 DIAGNOSIS — O10013 Pre-existing essential hypertension complicating pregnancy, third trimester: Secondary | ICD-10-CM | POA: Diagnosis not present

## 2020-09-14 DIAGNOSIS — O26893 Other specified pregnancy related conditions, third trimester: Secondary | ICD-10-CM | POA: Diagnosis present

## 2020-09-14 DIAGNOSIS — Z3689 Encounter for other specified antenatal screening: Secondary | ICD-10-CM

## 2020-09-14 DIAGNOSIS — R102 Pelvic and perineal pain: Secondary | ICD-10-CM

## 2020-09-14 DIAGNOSIS — O24420 Gestational diabetes mellitus in childbirth, diet controlled: Secondary | ICD-10-CM | POA: Diagnosis not present

## 2020-09-14 DIAGNOSIS — R109 Unspecified abdominal pain: Secondary | ICD-10-CM | POA: Diagnosis not present

## 2020-09-14 DIAGNOSIS — Z79899 Other long term (current) drug therapy: Secondary | ICD-10-CM | POA: Diagnosis not present

## 2020-09-14 DIAGNOSIS — O133 Gestational [pregnancy-induced] hypertension without significant proteinuria, third trimester: Secondary | ICD-10-CM

## 2020-09-14 LAB — LIPASE, BLOOD: Lipase: 28 U/L (ref 11–51)

## 2020-09-14 LAB — COMPREHENSIVE METABOLIC PANEL
ALT: 29 U/L (ref 0–44)
AST: 21 U/L (ref 15–41)
Albumin: 3 g/dL — ABNORMAL LOW (ref 3.5–5.0)
Alkaline Phosphatase: 90 U/L (ref 38–126)
Anion gap: 9 (ref 5–15)
BUN: 7 mg/dL (ref 6–20)
CO2: 21 mmol/L — ABNORMAL LOW (ref 22–32)
Calcium: 8.8 mg/dL — ABNORMAL LOW (ref 8.9–10.3)
Chloride: 106 mmol/L (ref 98–111)
Creatinine, Ser: 0.59 mg/dL (ref 0.44–1.00)
GFR, Estimated: >60 mL/min (ref >60)
Glucose, Bld: 118 mg/dL — ABNORMAL HIGH (ref 70–99)
Potassium: 3.8 mmol/L (ref 3.5–5.1)
Sodium: 136 mmol/L (ref 135–145)
Total Bilirubin: 0.8 mg/dL (ref 0.3–1.2)
Total Protein: 6.1 g/dL — ABNORMAL LOW (ref 6.5–8.1)

## 2020-09-14 LAB — TYPE AND SCREEN
ABO/RH(D): A POS
Antibody Screen: NEGATIVE

## 2020-09-14 LAB — URINALYSIS, ROUTINE W REFLEX MICROSCOPIC
Bilirubin Urine: NEGATIVE
Glucose, UA: NEGATIVE mg/dL
Hgb urine dipstick: NEGATIVE
Ketones, ur: NEGATIVE mg/dL
Leukocytes,Ua: NEGATIVE
Nitrite: NEGATIVE
Protein, ur: NEGATIVE mg/dL
Specific Gravity, Urine: 1.023 (ref 1.005–1.030)
pH: 6 (ref 5.0–8.0)

## 2020-09-14 LAB — RESP PANEL BY RT-PCR (FLU A&B, COVID) ARPGX2
Influenza A by PCR: NEGATIVE
Influenza B by PCR: NEGATIVE
SARS Coronavirus 2 by RT PCR: NEGATIVE

## 2020-09-14 LAB — CBC
HCT: 39.9 % (ref 36.0–46.0)
Hemoglobin: 13.4 g/dL (ref 12.0–15.0)
MCH: 32.1 pg (ref 26.0–34.0)
MCHC: 33.6 g/dL (ref 30.0–36.0)
MCV: 95.7 fL (ref 80.0–100.0)
Platelets: 165 10*3/uL (ref 150–400)
RBC: 4.17 MIL/uL (ref 3.87–5.11)
RDW: 13.4 % (ref 11.5–15.5)
WBC: 8.5 10*3/uL (ref 4.0–10.5)
nRBC: 0 % (ref 0.0–0.2)

## 2020-09-14 LAB — GLUCOSE, CAPILLARY
Glucose-Capillary: 90 mg/dL (ref 70–99)
Glucose-Capillary: 95 mg/dL (ref 70–99)

## 2020-09-14 LAB — PROTEIN / CREATININE RATIO, URINE
Creatinine, Urine: 157.67 mg/dL
Protein Creatinine Ratio: 0.15 mg/mg{Cre} (ref 0.00–0.15)
Total Protein, Urine: 23 mg/dL

## 2020-09-14 MED ORDER — SODIUM CHLORIDE 0.9 % IV SOLN
25.0000 mg | Freq: Once | INTRAVENOUS | Status: AC
  Administered 2020-09-14: 25 mg via INTRAVENOUS
  Filled 2020-09-14: qty 1

## 2020-09-14 MED ORDER — NIFEDIPINE ER OSMOTIC RELEASE 60 MG PO TB24: 60.0000 mg | ORAL_TABLET | Freq: Every day | ORAL | Status: DC

## 2020-09-14 MED ORDER — NIFEDIPINE ER OSMOTIC RELEASE 60 MG PO TB24
60.0000 mg | ORAL_TABLET | Freq: Every day | ORAL | Status: DC
  Administered 2020-09-14 – 2020-09-15 (×2): 60 mg via ORAL
  Filled 2020-09-14 (×2): qty 1

## 2020-09-14 MED ORDER — HYDROMORPHONE HCL 1 MG/ML IJ SOLN
1.0000 mg | INTRAMUSCULAR | Status: DC | PRN
  Administered 2020-09-14: 1 mg via INTRAVENOUS
  Filled 2020-09-14: qty 1

## 2020-09-14 MED ORDER — HYDROMORPHONE HCL 1 MG/ML IJ SOLN
1.0000 mg | INTRAMUSCULAR | Status: DC | PRN
  Administered 2020-09-14 – 2020-09-15 (×5): 1 mg via INTRAVENOUS
  Filled 2020-09-14 (×5): qty 1

## 2020-09-14 MED ORDER — PRENATAL MULTIVITAMIN CH
1.0000 | ORAL_TABLET | Freq: Every day | ORAL | Status: DC
  Administered 2020-09-14 – 2020-09-15 (×2): 1 via ORAL
  Filled 2020-09-14 (×2): qty 1

## 2020-09-14 MED ORDER — ASPIRIN EC 81 MG PO TBEC
81.0000 mg | DELAYED_RELEASE_TABLET | Freq: Every day | ORAL | Status: DC
  Administered 2020-09-14 – 2020-09-15 (×2): 81 mg via ORAL
  Filled 2020-09-14 (×2): qty 1

## 2020-09-14 MED ORDER — LACTATED RINGERS IV SOLN: INTRAVENOUS | Status: DC

## 2020-09-14 MED ORDER — PRENATAL MULTIVITAMIN CH: 1.0000 | ORAL_TABLET | Freq: Every day | ORAL | Status: DC

## 2020-09-14 MED ORDER — DOCUSATE SODIUM 100 MG PO CAPS: 100.0000 mg | ORAL_CAPSULE | Freq: Every day | ORAL | Status: DC

## 2020-09-14 MED ORDER — METFORMIN HCL ER 500 MG PO TB24
1000.0000 mg | ORAL_TABLET | Freq: Every evening | ORAL | Status: DC
  Administered 2020-09-14 – 2020-09-15 (×2): 1000 mg via ORAL
  Filled 2020-09-14 (×4): qty 2

## 2020-09-14 MED ORDER — CALCIUM CARBONATE ANTACID 500 MG PO CHEW: 2.0000 | CHEWABLE_TABLET | ORAL | Status: DC | PRN

## 2020-09-14 MED ORDER — ACETAMINOPHEN 325 MG PO TABS: 650.0000 mg | ORAL_TABLET | ORAL | Status: DC | PRN

## 2020-09-14 MED ORDER — LACTATED RINGERS IV BOLUS
1000.0000 mL | Freq: Once | INTRAVENOUS | Status: AC
  Administered 2020-09-14: 1000 mL via INTRAVENOUS

## 2020-09-14 MED ORDER — ZOLPIDEM TARTRATE 5 MG PO TABS: 5.0000 mg | ORAL_TABLET | Freq: Every evening | ORAL | Status: DC | PRN

## 2020-09-14 MED ORDER — DOCUSATE SODIUM 100 MG PO CAPS
100.0000 mg | ORAL_CAPSULE | Freq: Every day | ORAL | Status: DC
  Administered 2020-09-14 – 2020-09-15 (×2): 100 mg via ORAL
  Filled 2020-09-14 (×2): qty 1

## 2020-09-14 MED ORDER — TRAMADOL HCL 50 MG PO TABS
100.0000 mg | ORAL_TABLET | Freq: Four times a day (QID) | ORAL | Status: DC
  Administered 2020-09-14 – 2020-09-16 (×7): 100 mg via ORAL
  Filled 2020-09-14 (×7): qty 2

## 2020-09-14 MED ORDER — ONDANSETRON HCL 4 MG/2ML IJ SOLN
4.0000 mg | Freq: Once | INTRAMUSCULAR | Status: AC
  Administered 2020-09-14: 4 mg via INTRAVENOUS
  Filled 2020-09-14: qty 2

## 2020-09-14 NOTE — MAU Note (Signed)
Sheryl Velasquez is a 30 y.o. at [redacted]w[redacted]d here in MAU reporting: onset on right sided back pain since this morning. Pain is making her vomit. Had kidney stones about a month ago and the pain stopped and then restarted today. Denies bleeding or LOF. DFM  Onset of complaint: today  Pain score: 10/10  Vitals:   09/14/20 0958  BP: (!) 147/84  Pulse: 87  Resp: 20  Temp: 98.3 F (36.8 C)  SpO2: 100%     FHT: EFM applied in room  Lab orders placed from triage: UA

## 2020-09-14 NOTE — MAU Provider Note (Signed)
History     CSN: [id number]  Arrival date and time: 09/14/20 0941   Event Date/Time   First Provider Initiated Contact with Patient 09/14/20 1020      Chief Complaint  Patient presents with  . Back Pain  . Decreased Fetal Movement   30 y.o. G3P1011 @35 .2 wks presenting with right flank pain. Reports sudden onset this am. Pain is intermittent, colicky, and sharp. Rates pain 10/10. She took Tramadol but it didn't help and she started vomiting. She thinks it may be kidney stone. Denies urinary sx. Denies pregnancy complaints. She was seen in MAU last month for same sx and US showed a renal mass but no stones. +FM.   OB History    Gravida  3   Para  1   Term  1   Preterm      AB  1   Living  1     SAB  1   IAB      Ectopic      Multiple      Live Births  1           Past Medical History:  Diagnosis Date  . Diabetes mellitus without complication ([place])   . Fatty liver   . Gestational diabetes   . Hypertension   . PCOS (polycystic ovarian syndrome)     Past Surgical History:  Procedure Laterality Date  . CHOLECYSTECTOMY  2015  . WISDOM TOOTH EXTRACTION  2011    Family History  Problem Relation Age of Onset  . Diabetes Mother   . Hypertension Mother   . Hypertension Father     Social History   Tobacco Use  . Smoking status: Never Smoker  . Smokeless tobacco: Never Used  Vaping Use  . Vaping Use: Never used  Substance Use Topics  . Alcohol use: Never  . Drug use: Never    Allergies: No Known Allergies  Medications Prior to Admission  Medication Sig Dispense Refill Last Dose  . aspirin 81 MG chewable tablet Chew by mouth daily.     . benzonatate (TESSALON PERLES) 100 MG capsule Take 2 capsules (200 mg total) by mouth 3 (three) times daily as needed for cough. 30 capsule 0   . metFORMIN (GLUCOPHAGE-XR) 750 MG 24 hr tablet Take 1,000 mg by mouth daily with supper.     Marland Kitchen NIFEdipine (ADALAT CC) 60 MG 24 hr tablet Take 60 mg by mouth daily.      . Prenatal Vit-Fe Fumarate-FA (MULTIVITAMIN-PRENATAL) 27-0.8 MG TABS tablet Take 1 tablet by mouth daily at 12 noon.     . traMADol (ULTRAM) 50 MG tablet Take 1-2 tablets (50-100 mg total) by mouth every 6 (six) hours as needed. 15 tablet 0   . zinc gluconate 50 MG tablet Take 50 mg by mouth daily.       Review of Systems  Gastrointestinal: Positive for nausea and vomiting.  Genitourinary: Positive for flank pain.  Musculoskeletal: Positive for back pain.   Physical Exam   Blood pressure 131/71, pulse 69, temperature 98.3 F (36.8 C), temperature source Oral, resp. rate 20, SpO2 98 %. Patient Vitals for the past 24 hrs:  BP Temp Temp src Pulse Resp SpO2  09/14/20 1401 131/71 -- -- 69 -- --  09/14/20 1346 126/66 -- -- 79 -- --  09/14/20 1331 126/61 -- -- 66 -- --  09/14/20 1316 129/78 -- -- 97 -- --  09/14/20 1301 117/66 -- -- 72 -- --  09/14/20 1246 121/72 -- --  71 -- --  09/14/20 1231 140/70 -- -- 69 -- --  09/14/20 1220 -- -- -- -- -- 98 %  09/14/20 1216 123/77 -- -- 72 -- --  09/14/20 1215 -- -- -- -- -- 97 %  09/14/20 1210 -- -- -- -- -- 98 %  09/14/20 1201 (!) 157/92 -- -- 72 -- --  09/14/20 1200 -- -- -- -- -- 100 %  09/14/20 1155 -- -- -- -- -- 99 %  09/14/20 1150 -- -- -- -- -- 100 %  09/14/20 1146 (!) 161/94 -- -- 78 -- --  09/14/20 1145 -- -- -- -- -- 99 %  09/14/20 1140 -- -- -- -- -- 100 %  09/14/20 1135 -- -- -- -- -- 98 %  09/14/20 1133 (!) 154/83 -- -- 75 -- --  09/14/20 1055 -- -- -- -- -- 99 %  09/14/20 1050 -- -- -- -- -- 100 %  09/14/20 1048 (!) 152/96 -- -- 80 -- --  09/14/20 1030 -- -- -- -- -- 99 %  09/14/20 1025 -- -- -- -- -- 100 %  09/14/20 1020 -- -- -- -- -- 99 %  09/14/20 1015 -- -- -- -- -- 100 %  09/14/20 1010 -- -- -- -- -- 99 %  09/14/20 1007 135/81 -- -- 83 -- 99 %  09/14/20 1005 -- -- -- -- -- 99 %  09/14/20 1000 -- -- -- -- -- 99 %  09/14/20 0958 (!) 147/84 98.3 F (36.8 C) Oral 87 20 100 %  09/14/20 0957 (!) 125/109 -- -- 88 -- --    Physical Exam Vitals and nursing note reviewed.  Constitutional:      General: She is in acute distress.     Appearance: Normal appearance.  HENT:     Head: Normocephalic and atraumatic.  Cardiovascular:     Rate and Rhythm: Normal rate.  Pulmonary:     Effort: Pulmonary effort is normal. No respiratory distress.  Abdominal:     Palpations: Abdomen is soft.     Tenderness: There is no abdominal tenderness.     Comments: Rt flank TTP  Musculoskeletal:        General: Normal range of motion.     Cervical back: Normal range of motion.  Skin:    General: Skin is warm and dry.  Neurological:     General: No focal deficit present.     Mental Status: She is alert and oriented to person, place, and time.  Psychiatric:        Mood and Affect: Mood normal.        Behavior: Behavior normal.    EFM: 145 bpm, mod variability, + accels, rare variable decels Toco: rare  Results for orders placed or performed during the hospital encounter of 09/14/20 (from the past 24 hour(s))  CBC     Status: None   Collection Time: 09/14/20 10:35 AM  Result Value Ref Range   WBC 8.5 4.0 - 10.5 K/uL   RBC 4.17 3.87 - 5.11 MIL/uL   Hemoglobin 13.4 12.0 - 15.0 g/dL   HCT 39.9 36.0 - 46.0 %   MCV 95.7 80.0 - 100.0 fL   MCH 32.1 26.0 - 34.0 pg   MCHC 33.6 30.0 - 36.0 g/dL   RDW 13.4 11.5 - 15.5 %   Platelets 165 150 - 400 K/uL   nRBC 0.0 0.0 - 0.2 %  Comprehensive metabolic panel     Status: Abnormal   Collection  Time: 09/14/20 10:35 AM  Result Value Ref Range   Sodium 136 135 - 145 mmol/L   Potassium 3.8 3.5 - 5.1 mmol/L   Chloride 106 98 - 111 mmol/L   CO2 21 (L) 22 - 32 mmol/L   Glucose, Bld 118 (H) 70 - 99 mg/dL   BUN 7 6 - 20 mg/dL   Creatinine, Ser 0.59 0.44 - 1.00 mg/dL   Calcium 8.8 (L) 8.9 - 10.3 mg/dL   Total Protein 6.1 (L) 6.5 - 8.1 g/dL   Albumin 3.0 (L) 3.5 - 5.0 g/dL   AST 21 15 - 41 U/L   ALT 29 0 - 44 U/L   Alkaline Phosphatase 90 38 - 126 U/L   Total Bilirubin 0.8 0.3 -  1.2 mg/dL   GFR, Estimated >60 >60 mL/min   Anion gap 9 5 - 15  Lipase, blood     Status: None   Collection Time: 09/14/20 10:35 AM  Result Value Ref Range   Lipase 28 11 - 51 U/L  Protein / creatinine ratio, urine     Status: None   Collection Time: 09/14/20  1:08 PM  Result Value Ref Range   Creatinine, Urine 157.67 mg/dL   Total Protein, Urine 23 mg/dL   Protein Creatinine Ratio 0.15 0.00 - 0.15 mg/mg[Cre]  Urinalysis, Routine w reflex microscopic Urine, Clean Catch     Status: Abnormal   Collection Time: 09/14/20  1:09 PM  Result Value Ref Range   Color, Urine YELLOW YELLOW   APPearance HAZY (A) CLEAR   Specific Gravity, Urine 1.023 1.005 - 1.030   pH 6.0 5.0 - 8.0   Glucose, UA NEGATIVE NEGATIVE mg/dL   Hgb urine dipstick NEGATIVE NEGATIVE   Bilirubin Urine NEGATIVE NEGATIVE   Ketones, ur NEGATIVE NEGATIVE mg/dL   Protein, ur NEGATIVE NEGATIVE mg/dL   Nitrite NEGATIVE NEGATIVE   Leukocytes,Ua NEGATIVE NEGATIVE    US Renal  Result Date: 09/14/2020 CLINICAL DATA:  Right flank pain, [redacted] weeks pregnant. EXAM: RENAL / URINARY TRACT ULTRASOUND COMPLETE COMPARISON:  August 18, 2020 FINDINGS: Right Kidney: Renal measurements: 11.6 x 5.5 x 4.3 cm = volume: 144 mL. Echogenicity within normal limits. No hydronephrosis visualized. 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 Left Kidney: Renal measurements: 12.5 x 6.1 x 5.6 cm = volume: 225 mL. Echogenicity within normal limits. No mass or hydronephrosis visualized. Bladder: Appears normal for degree of bladder distention. Other: Bilateral ureteral jets are visualized. IMPRESSION: No hydronephrosis. 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 Electronically Signed   By: Dahlia Bailiff MD   On: 09/14/2020 11:47   MAU Course   Procedures LR Dilaudid Phenergan  MDM Review of prenatal records shows her pregnancy is complicated by D7OEU, CHTN, obesity, PCOS, and Covid-19.  Labs and imaging ordered and reviewed. Has required multiple doses of Dilaudid. Suspect kidney stone although not confirmed with [place]. Consult with Dr. Roselie Awkward, recommend admit for obs for pain management. Dr. Wilhelmenia Blase notified of presentation, clinical findings, and recommendation. Plan for admit.  Assessment and Plan  [redacted] weeks gestation NST reactive Flank pain Admit to [facility] unit [facility] per Dr. Gilman Buttner, CNM 09/14/2020, 3:05 PM

## 2020-09-14 NOTE — H&P (Signed)
ANTEPARTUM H&P  S: Patient is a 30yo G3P1011 at 3 2/7 who presents to MAU with complaints of sudden right sided colicky pain that worke her form sleep this morning. Was seen on 4/27 in MAU for similar pain, renal ultrasound done and negative for obstruction, hydronephrosis or visible stone however was given tamsulosin plus toradol and discharged home after IV fluids. States she tried to take toradol again, did not touch pain. Endorses associated nausea. Denies VB, LOF, notes +FM. States pain is akin to a twisting eminating from right side around to front.   PNC c/b  1) CHTN on meds - Procardia 60XLQHS, well-controlled, baseline labs WNL 2) GDMA2 - controlled on PO metformin 1033m with dinner. Diagnosed at 22wks, failed early 1 and 3 hr GTT 3) PCOS and morbid obesity - conceived on PO metformin, baseline A1c 5.2, BMI 49 4) Steatosis of liver -s/p lap chole in teens, follows w/ GI (Dr MCollene Mares for elevated LFTs > resolved by 19wks (30s) > normal this admission   Declined genetics. Last growth scan on 08/25/20 showed EFW 84.2%tile, AFI 16. Antenatal testing (BPP, NST) all WNL  Denies fevers, chills, purulent discharge, strenuous activity/lifting, trauma/fall. Denies skin changes including yellowing or turgor. Denies SOB, CP, palpitations. Denies frank contractions  O: BP 131/71   Pulse 69   Temp 98.3 F (36.8 C) (Oral)   Resp 20   Ht 5' 2"  (1.575 m)   Wt 121.6 kg   SpO2 98%   BMI 49.02 kg/m   Exam: General: Tired but pleasant appearing morbidly obese appearing patient sitting up in bed CV: CTAB in upper fields, RRR, no M/R?G Abd: Gravid, non-tender fundus. Neg RUQ pain. No palpable masses, fluid wave. Overall benign abdomen Back: + right CVA TTP (states improved after IV dilaudid x3 146m. Rest WNL MSK: neg calf TTP/Homan's BL  LABS: H/H 13.4/39.9, plt 165, WBC 8.5 Cr 0.59, AST/ALT 21/29, Alk Phos 90 Lipase 28, uPC 0.15 UA neg for protein, blood, ketones, nitrites, lukes  A/P: This is  a 30yo G3P1011 @ 35 2/7 by 7wk scan NOT c/w LMP admitted to observation for pain control. Second episode of right-sided colicky pain characteristic of ureteral calculus, however imaging thus far has been negative for visible lesion as well as hydronephrosis.  Phone consult to Dr [facility] done, appreciate recommendations. Next step imaging would be CT stone study, would require consent given contrast admin. Given advanced third trimester, risks not as high however patient was consented for study and appropriate forms signed -Low concern for obstruction, urosepsis or progressing infection given UA plus overall benign vital signs and normal WBC -Goal is for hydration and pain control    *At time of admission, patient pain 4/10 on tramadol PO, no further IV dilaudid pushes required    *Tylenol 625 q4hr PRN, ultram 10038m6 at this time -NST q-shift, GDM diet, CBG per protocol, continue PO metformin 1000m62mHTN: initially elevated 2/2 pain, no WNL, continue home Procardia 60XL plus baby ASA  Will follow up imaging today, if pain well-controlled in AM can DC home with office follow-up. Reviewed with patient (confirmed by Urology) that multiple pain episodes can occur if this is indeed a stone which will only improve after passing. Also reviewed growing uterus may be impinging as well.  Close eye on status.

## 2020-09-15 ENCOUNTER — Observation Stay (HOSPITAL_BASED_OUTPATIENT_CLINIC_OR_DEPARTMENT_OTHER): Payer: 59

## 2020-09-15 ENCOUNTER — Observation Stay (HOSPITAL_COMMUNITY): Payer: 59

## 2020-09-15 LAB — GLUCOSE, CAPILLARY
Glucose-Capillary: 80 mg/dL (ref 70–99)
Glucose-Capillary: 72 mg/dL (ref 70–99)
Glucose-Capillary: 65 mg/dL — ABNORMAL LOW (ref 70–99)
Glucose-Capillary: 72 mg/dL (ref 70–99)
Glucose-Capillary: 87 mg/dL (ref 70–99)

## 2020-09-15 MED ORDER — ONDANSETRON HCL 4 MG/2ML IJ SOLN
4.0000 mg | Freq: Once | INTRAMUSCULAR | Status: AC
  Administered 2020-09-15: 4 mg via INTRAVENOUS
  Filled 2020-09-15: qty 2

## 2020-09-15 MED ORDER — HYDROMORPHONE HCL 1 MG/ML IJ SOLN: 1.0000 mg | INTRAMUSCULAR | Status: DC | PRN

## 2020-09-15 MED ORDER — HYDROMORPHONE HCL 2 MG PO TABS
1.0000 mg | ORAL_TABLET | ORAL | Status: DC | PRN
  Administered 2020-09-15: 1 mg via ORAL
  Filled 2020-09-15: qty 1

## 2020-09-15 NOTE — Progress Notes (Signed)
Patient ID: Sheryl Velasquez, female   DOB: Feb 25, 1991, 30 y.o.   MRN: [id number] Pt reports pain is negligible at this time as has been sleeping however she has gotten sharps pangs intermittently all through the day. Pain was initially in right lower anterior quadrant but is now in right lower back. She denies any associated fever and no dysuria. She had nausea related to pain earlier today and medication did help. +FMs VSS GEN - in mild discomfort when moving EFM - 130s, cat1  TOCO - only 1 contraction noted;  SVE - deferred  CT:  61mm nonobstructing calculus in interpolar collecting system of left kidney. No ureteral stones or urinary tract obstruction noted  5.3x2.6x4.7cm right ovarian cyst along with other cysts and follicles noted in right ovary  Severe hepatic steatosis   A/P: 30yo G3P1011 female at 29 5/[redacted]wks gestation with abdominal pain that appears to be due to multicystic right ovary - stable at this time but with fluctuating pain control       - Plan [place] to better assess ovarian cyst - check for torsion       - continue to administer pain control with oral dilaudid and tylenol prn   2) CHTN on meds - Procardia 60XLQHS, well-controlled, baseline labs WNL 3) GDMA2 - controlled on PO metformin 1000mg  with dinner. Diagnosed at 22wks, failed early 1 and 3 hr GTT 4) PCOS and morbid obesity - conceived on PO metformin, baseline A1c 5.2, BMI 49 5) Steatosis of liver -s/p lap chole in teens, follows w/ GI (Dr Collene Mares) for elevated LFTs > resolved by 29wks (30s) > normal this admission

## 2020-09-16 ENCOUNTER — Observation Stay (HOSPITAL_BASED_OUTPATIENT_CLINIC_OR_DEPARTMENT_OTHER): Payer: 59

## 2020-09-16 DIAGNOSIS — O10013 Pre-existing essential hypertension complicating pregnancy, third trimester: Secondary | ICD-10-CM

## 2020-09-16 DIAGNOSIS — O99213 Obesity complicating pregnancy, third trimester: Secondary | ICD-10-CM

## 2020-09-16 DIAGNOSIS — O24415 Gestational diabetes mellitus in pregnancy, controlled by oral hypoglycemic drugs: Secondary | ICD-10-CM | POA: Diagnosis not present

## 2020-09-16 DIAGNOSIS — O133 Gestational [pregnancy-induced] hypertension without significant proteinuria, third trimester: Secondary | ICD-10-CM

## 2020-09-16 DIAGNOSIS — Z3A35 35 weeks gestation of pregnancy: Secondary | ICD-10-CM

## 2020-09-16 LAB — GLUCOSE, CAPILLARY
Glucose-Capillary: 67 mg/dL — ABNORMAL LOW (ref 70–99)
Glucose-Capillary: 73 mg/dL (ref 70–99)
Glucose-Capillary: 65 mg/dL — ABNORMAL LOW (ref 70–99)
Glucose-Capillary: 85 mg/dL (ref 70–99)

## 2020-09-16 LAB — COMPREHENSIVE METABOLIC PANEL
ALT: 27 U/L (ref 0–44)
AST: 25 U/L (ref 15–41)
Albumin: 2.5 g/dL — ABNORMAL LOW (ref 3.5–5.0)
Alkaline Phosphatase: 88 U/L (ref 38–126)
Anion gap: 6 (ref 5–15)
BUN: <5 mg/dL — ABNORMAL LOW (ref 6–20)
CO2: 27 mmol/L (ref 22–32)
Calcium: 8.6 mg/dL — ABNORMAL LOW (ref 8.9–10.3)
Chloride: 103 mmol/L (ref 98–111)
Creatinine, Ser: 0.55 mg/dL (ref 0.44–1.00)
GFR, Estimated: >60 mL/min (ref >60)
Glucose, Bld: 79 mg/dL (ref 70–99)
Potassium: 3.9 mmol/L (ref 3.5–5.1)
Sodium: 136 mmol/L (ref 135–145)
Total Bilirubin: 0.7 mg/dL (ref 0.3–1.2)
Total Protein: 5.2 g/dL — ABNORMAL LOW (ref 6.5–8.1)

## 2020-09-16 LAB — PROTEIN / CREATININE RATIO, URINE
Creatinine, Urine: 26.37 mg/dL
Total Protein, Urine: <6 mg/dL

## 2020-09-16 LAB — LACTATE DEHYDROGENASE: LDH: 128 U/L (ref 98–192)

## 2020-09-16 LAB — URIC ACID: Uric Acid, Serum: 5.3 mg/dL (ref 2.5–7.1)

## 2020-09-16 MED ORDER — TRAMADOL HCL 50 MG PO TABS: 100.0000 mg | ORAL_TABLET | Freq: Four times a day (QID) | ORAL | 1 refills | Status: DC | PRN

## 2020-09-16 MED ORDER — HYDROMORPHONE HCL 2 MG PO TABS: 1.0000 mg | ORAL_TABLET | Freq: Four times a day (QID) | ORAL | 0 refills | Status: DC | PRN

## 2020-09-16 NOTE — Discharge Summary (Signed)
Physician Discharge Summary  Patient ID: Sheryl Velasquez MRN: [id number] DOB/AGE: 21-Jan-1991 30 y.o.  Admit date: 09/14/2020 Discharge date: 09/16/2020  Admission Diagnoses:  Discharge Diagnoses:  Active Problems:   [redacted] weeks gestation of pregnancy   NST (non-stress test) reactive   Flank pain in pregnant patient   Discharged Condition: good  Hospital Course: Patient admitted on 5/24 at 35 2/7 for pain controlled after recurrent episode of right colicky flank pain previously associated with kidney stone. However, CT stone imaging obtained (with consent) was negative for right ureteral calculus but did show approx 6cm ovarian cyst. Ultrasound ordered HD#2 was neg for evidence of torsion and no concerning features were appreciated. BPP obtained HD#3 was 8/8. Pain controlled on scheduled Ultram, required PRN breakthrough dilaudid only until HD#2 morning, transitioned from IV to PO. Patient continually endorsed +FM, denies VB, LOF. Discharged home in stable condition with plans for close f/u in 1wk outpatient  Consults: urology (phone)  Significant Diagnostic Studies: radiology: TVUS IMPRESSION: 5.3 x 3 x 9 x 4.6 cm anechoic cyst in the right ovary without significant internal complexity. Normal color flow and waveforms to the right ovary without sonographic features of ovarian torsion. Recommend follow-up US in 3-6 months. Note: This recommendation does not apply to premenarchal patients or to those with increased risk (genetic, family history, elevated tumor markers or other high-risk factors) of ovarian cancer. Reference: Radiology 2019 Nov; 293(2):359-371.  Nonvisualization of the left ovary.  This exam is performed on an emergent basis and is not intended to comprehensively evaluate patient's gestational metrics such as fetal size, dating, or anatomy; follow-up complete OB US should be considered if further fetal assessment is warranted.  CT renal stone Reproductive: Gravid  uterus with single IUP. Left ovary is not confidently identified. Right ovary demonstrates multiple low-attenuation lesions, largest of which measures up to 5.3 x 2.6 x 4.7 cm (axial image 62 of series 3 and coronal image 55 of series 6), compatible with a combination of small cysts and follicles.  Other: No significant volume of ascites.  No pneumoperitoneum.  Musculoskeletal: There are no aggressive appearing lytic or blastic lesions noted in the visualized portions of the skeleton.  IMPRESSION: 1. 2 mm nonobstructive calculus in the interpolar collecting system of left kidney. No ureteral stones or findings of urinary tract obstruction are noted at this time. 2. Multiple cysts and follicles in the right ovary, as above. 3. Severe hepatic steatosis. 4. Gravid uterus with single IUP. 5. Additional incidental findings, as above.    Discharge Exam: Blood pressure 128/74, pulse 76, temperature 98.6 F (37 C), temperature source Oral, resp. rate 18, height 5\' 2"  (1.575 m), weight 121.6 kg, SpO2 98 %. Gen: NAD EFM: Cat 1 TOCO neg SVE deferred Back: neg CVA TTP  Disposition: Discharge disposition: 01-Home or Self Care       Discharge Instructions    Discharge activity:  No Restrictions   Complete by: As directed    Fetal Kick Count:  Lie on our left side for one hour after a meal, and count the number of times your baby kicks.  If it is less than 5 times, get up, move around and drink some juice.  Repeat the test 30 minutes later.  If it is still less than 5 kicks in an hour, notify your doctor.   Complete by: As directed    Notify physician for a general feeling that "something is not right"   Complete by: As directed    Notify physician  for increase or change in vaginal discharge   Complete by: As directed    Notify physician for intestinal cramps, with or without diarrhea, sometimes described as "gas pain"   Complete by: As directed    Notify physician for leaking  of fluid   Complete by: As directed    Notify physician for low, dull backache, unrelieved by heat or Tylenol   Complete by: As directed    Notify physician for menstrual like cramps   Complete by: As directed    Notify physician for pelvic pressure   Complete by: As directed    Notify physician for uterine contractions.  These may be painless and feel like the uterus is tightening or the baby is  "balling up"   Complete by: As directed    Notify physician for vaginal bleeding   Complete by: As directed    PRETERM LABOR:  Includes any of the follwing symptoms that occur between 20 - [redacted] weeks gestation.  If these symptoms are not stopped, preterm labor can result in preterm delivery, placing your baby at risk   Complete by: As directed      Allergies as of 09/16/2020   No Known Allergies     Medication List    STOP taking these medications   benzonatate 100 MG capsule Commonly known as: Tessalon Perles   zinc gluconate 50 MG tablet     TAKE these medications   aspirin 81 MG chewable tablet Chew by mouth daily.   HYDROmorphone 2 MG tablet Commonly known as: DILAUDID Take 0.5 tablets (1 mg total) by mouth every 6 (six) hours as needed for severe pain.   metFORMIN 750 MG 24 hr tablet Commonly known as: GLUCOPHAGE-XR Take 1,000 mg by mouth daily with supper.   multivitamin-prenatal 27-0.8 MG Tabs tablet Take 1 tablet by mouth daily at 12 noon.   NIFEdipine 60 MG 24 hr tablet Commonly known as: ADALAT CC Take 60 mg by mouth daily.   traMADol 50 MG tablet Commonly known as: ULTRAM Take 2 tablets (100 mg total) by mouth every 6 (six) hours as needed. What changed: how much to take        Signed: Deliah Boston 09/16/2020, 5:26 PM

## 2020-09-16 NOTE — Progress Notes (Signed)
Patient ID: Sheryl Velasquez, female   DOB: [birth year], 30 y.o.   MRN: [id number] Late entry   US findings as follows:   Right ovary measuring 8.6x5.3x6.4cm with a 5.3x4.6x3.9cm anechoic internal cyst with no internal complexity. Normal color flow and waveforms. No apparent features of torsion  BPP - result not noted   Plan: Will likely discharge pt to home this am with plan for outpatient follow up for surveillance and pain control

## 2020-09-16 NOTE — Progress Notes (Signed)
Patient ID: Monna Crean, female   DOB: [birth year], 30 y.o.   MRN: [id number] Pt reports slept well last night. No pain. This morning she has been on phone with her daughter who is having a flare of eczema thus has felt worried. She denies HA, blurry vision or pain. +Fms. Has not eaten yet - about to. No new complaints  VS: BP 151/87 ( 110-151/55-93) GEN - NAD EFM - pending NST after breakfast , cat 1 last night  TOCO - no contractions  SVE - deferred  BPP last night reported verbally as 6/8 ( -2 for breathing movements); baby noted to be in breech position  A/P: HD#3         [id number] at 35 4/[redacted]wks gestation with pelvic pain likely due to enlarged simple appearing right ovarian cyst ( 5cm) - currently stable; continue with pain control . Plan to given dilaudid for outpt pain control   - Non obstructing left sided nephrolith noted: recommend hydration; pain control prn  - Repeat BPP this afternoon with NST  - Newly noted elevation in BP- serial monitor q 4 hrs and treat if indicated. Obtain preE labs. If labs wnl and BP stable then likely discharge to home later today

## 2020-09-16 NOTE — Progress Notes (Signed)
Patient seen at bedside. Pain 0/10, has not used Dilaudid since yesterday. BP reviewed, WNL since AM exam. Reviewed BPP 8/8 and SIUP now vtx. Hst at this time reactive. Reviewed again imaging findings c/w right ovarian cyst without evidence of torsion. Will DC home with scheduled Ultram, small amount of PRN dilaudid, can use tylenol PRN. Torsion precautions given. Will flag office next week to continue weekly visits until delivery. All questions answered, patient in no distress during examination  BP 128/74 (BP Location: Left Arm)   Pulse 76   Temp 98.6 F (37 C) (Oral)   Resp 18   Ht 5\' 2"  (1.575 m)   Wt 121.6 kg   SpO2 98%   BMI 49.02 kg/m

## 2020-09-24 ENCOUNTER — Encounter (HOSPITAL_COMMUNITY): Payer: Self-pay

## 2020-09-24 ENCOUNTER — Other Ambulatory Visit (HOSPITAL_COMMUNITY): Payer: 59

## 2020-09-24 NOTE — Patient Instructions (Signed)
Sheryl Velasquez  09/24/2020   Your procedure is scheduled on:  10/02/2020  Arrive at [facility] at [employer] [facility] on [address] at [facility]  and [address]. You are invited to use the FREE valet parking or use the Visitor's parking deck.  Pick up the phone at the desk and dial [phone].  Call this number if you have problems the morning of surgery: [phone]  Remember:   Do not eat food:(After Midnight) Desps de medianoche.  Do not drink clear liquids: (After Midnight) Desps de medianoche.  Take these medicines the morning of surgery with A SIP OF WATER:  none   Do not wear jewelry, make-up or nail polish.  Do not wear lotions, powders, or perfumes. Do not wear deodorant.  Do not shave 48 hours prior to surgery.  Do not bring valuables to the hospital.  [facility] is not   responsible for any belongings or valuables brought to the hospital.  Contacts, dentures or bridgework may not be worn into surgery.  Leave suitcase in the car. After surgery it may be brought to your room.  For patients admitted to the hospital, checkout time is 11:00 AM the day of              discharge.      Please read over the following fact sheets that you were given:     Preparing for Surgery

## 2020-09-26 ENCOUNTER — Inpatient Hospital Stay (HOSPITAL_COMMUNITY)
Admission: AD | Admit: 2020-09-26 | Discharge: 2020-09-26 | Disposition: A | Discharge status: Home / Self Care | Payer: 59 | Attending: Obstetrics and Gynecology | Admitting: Obstetrics and Gynecology

## 2020-09-26 ENCOUNTER — Other Ambulatory Visit: Payer: Self-pay

## 2020-09-26 DIAGNOSIS — Z3A00 Weeks of gestation of pregnancy not specified: Secondary | ICD-10-CM | POA: Diagnosis not present

## 2020-09-26 DIAGNOSIS — O99713 Diseases of the skin and subcutaneous tissue complicating pregnancy, third trimester: Secondary | ICD-10-CM | POA: Insufficient documentation

## 2020-09-26 DIAGNOSIS — O24415 Gestational diabetes mellitus in pregnancy, controlled by oral hypoglycemic drugs: Secondary | ICD-10-CM | POA: Insufficient documentation

## 2020-09-26 DIAGNOSIS — L03011 Cellulitis of right finger: Secondary | ICD-10-CM

## 2020-09-26 DIAGNOSIS — O10913 Unspecified pre-existing hypertension complicating pregnancy, third trimester: Secondary | ICD-10-CM | POA: Insufficient documentation

## 2020-09-26 DIAGNOSIS — L4051 Distal interphalangeal psoriatic arthropathy: Secondary | ICD-10-CM | POA: Diagnosis present

## 2020-09-26 MED ORDER — CLINDAMYCIN HCL 300 MG PO CAPS: 300.0000 mg | ORAL_CAPSULE | Freq: Four times a day (QID) | ORAL | 0 refills | Status: AC

## 2020-09-26 NOTE — Discharge Instructions (Signed)
Cellulitis, Adult  Cellulitis is a skin infection. The infected area is often warm, red, swollen, and sore. It occurs most often in the arms and lower legs. It is very important to get treated for this condition. What are the causes? This condition is caused by bacteria. The bacteria enter through a break in the skin, such as a cut, burn, insect bite, open sore, or crack. What increases the risk? This condition is more likely to occur in people who:  Have a weak body defense system (immune system).  Have open cuts, burns, bites, or scrapes on the skin.  Are older than 29 years of age.  Have a blood sugar problem (diabetes).  Have a long-lasting (chronic) liver disease (cirrhosis) or kidney disease.  Are very overweight (obese).  Have a skin problem, such as: ? Itchy rash (eczema). ? Slow movement of blood in the veins (venous stasis). ? Fluid buildup below the skin (edema).  Have been treated with high-energy rays (radiation).  Use IV drugs. What are the signs or symptoms? Symptoms of this condition include:  Skin that is: ? Red. ? Streaking. ? Spotting. ? Swollen. ? Sore or painful when you touch it. ? Warm.  A fever.  Chills.  Blisters. How is this diagnosed? This condition is diagnosed based on:  Medical history.  Physical exam.  Blood tests.  Imaging tests. How is this treated? Treatment for this condition may include:  Medicines to treat infections or allergies.  Home care, such as: ? Rest. ? Placing cold or warm cloths (compresses) on the skin.  Hospital care, if the condition is very bad. Follow these instructions at home: Medicines  Take over-the-counter and prescription medicines only as told by your doctor.  If you were prescribed an antibiotic medicine, take it as told by your doctor. Do not stop taking it even if you start to feel better. General instructions  Drink enough fluid to keep your pee (urine) pale yellow.  Do not touch  or rub the infected area.  Raise (elevate) the infected area above the level of your heart while you are sitting or lying down.  Place cold or warm cloths on the area as told by your doctor.  Keep all follow-up visits as told by your doctor. This is important.   Contact a doctor if:  You have a fever.  You do not start to get better after 1-2 days of treatment.  Your bone or joint under the infected area starts to hurt after the skin has healed.  Your infection comes back. This can happen in the same area or another area.  You have a swollen bump in the area.  You have new symptoms.  You feel ill and have muscle aches and pains. Get help right away if:  Your symptoms get worse.  You feel very sleepy.  You throw up (vomit) or have watery poop (diarrhea) for a long time.  You see red streaks coming from the area.  Your red area gets larger.  Your red area turns dark in color. These symptoms may represent a serious problem that is an emergency. Do not wait to see if the symptoms will go away. Get medical help right away. Call your local emergency services (911 in the [place] Do not drive yourself to the hospital. Summary  Cellulitis is a skin infection. The area is often warm, red, swollen, and sore.  This condition is treated with medicines, rest, and cold and warm cloths.  Take all medicines   only as told by your doctor.  Tell your doctor if symptoms do not start to get better after 1-2 days of treatment. This information is not intended to replace advice given to you by your health care provider. Make sure you discuss any questions you have with your health care provider. Document Revised: 08/30/2017 Document Reviewed: 08/30/2017 Elsevier Patient Education  2021 [place].   Fingertip Infection There are two main types of fingertip infections:  Long-term (chronic) or acute paronychia. This is an infection that happens around your nail. This type of infection can  start in one nail or occur gradually over time and affect more than one nail. The fingernails that are infected may become thick and deformed. This condition can also happen suddenly (be acute).  Felon. This is a bacterial infection in the tip of your finger (pad). A felon infection can cause a painful collection of pus (an abscess) to form inside your fingertip. If the infection is not treated, the infection can spread as deep as the tendon or bone. What are the causes? Paronychia infection can be caused by:  Bacteria.  Funguses.  A mix of both bacteria and funguses. A felon infection is usually caused by the bacteria that are normally found on your skin. An infection can develop if the bacteria spread through your skin to the pad of tissue inside your fingertip. What increases the risk? You are more likely to develop a fingertip infection if:  You have diabetes.  You have a weak body's defense system (immune system).  You work with your hands.  Your hands are exposed to moisture, chemicals, or irritants for long periods of time.  You have poor circulation.  You bite, chew, or pick your fingernails. What are the signs or symptoms? Symptoms of paronychia infection may affect one or more fingernails and may include:  Pain, swelling, and redness around the nail.  Pus-filled pockets at the base or side of the fingernail (cuticle).  Thick fingernails that separate from the nail bed.  Pus that drains from the nail bed. Symptoms of a felon usually affect just one fingertip pad and include:  Severe, throbbing pain.  Redness.  Swelling.  Warmth.  Tenderness when the affected fingertip is touched. How is this diagnosed? This condition is diagnosed based on:  Your medical history.  A physical exam.  Testing. If there is pus draining from the infection, it may be swabbed and sent to the lab for a culture.  An X-ray. This may be done to see if the infection has spread to  the bone. How is this treated? Treatment for a fingertip infection may include:  Warm water or salt-water soaks several times per day.  Antibiotic medicine. This may be an ointment or pills.  Steroid ointment.  Antifungal pills.  Drainage of pus pockets. This is done by making an incision to open the fingertip to drain pus.  Wearing gloves to protect your nails. Follow these instructions at home: Medicines  Take or apply over-the-counter and prescription medicines only as told by your health care provider.  If you were prescribed an antibiotic medicine, take or apply it as told by your health care provider. Do not stop using the antibiotic even if you start to feel better. Wound care  Follow instructions from your health care provider about how to take care of your wound. Make sure you: ? Wash your hands with soap and water before and after you change your bandage (dressing). If soap and water  are not available, use hand sanitizer. ? Change your dressing as told by your health care provider. ? Leave stitches (sutures), skin glue, or adhesive strips in place. These skin closures may need to stay in place for 2 weeks or longer. If adhesive strip edges start to loosen and curl up, you may trim the loose edges. Do not remove adhesive strips completely unless your health care provider tells you to do that.  Clean the infected area each day with warm water or salt water, or as told by your health care provider. ? Gently wash the infected area with mild soap and water. ? Rinse the infected area with water to remove all soap. ? Pat the infected area dry with a clean towel. Do not rub it. ? To make a salt-water mixture, completely dissolve -1 tsp (3-6 g) of salt in 1 cup (237 mL) of warm water.  Check the infected area every day for more signs of infection. Watch for: ? More redness, swelling, or pain. ? More fluid or blood. ? Warmth. ? A bad smell. Bathing  Keep the dressing dry  until your health care provider says it can be removed.  Ask your health care provider if you may take baths, swim, shower, or use a hot tub. To help prevent spread of the infection, you may only be allowed to take sponge baths. This is rare.  Do not let your bandage get wet. Cover it with a watertight covering when you take a bath or shower. General instructions  Follow instructions from your health care provider about: ? How to take care of the infection. ? When and how you should change your bandage (dressing). ? When you should remove your dressing.  Raise (elevate) the infected area above the level of your heart while you are sitting or lying down or as told by your health care provider. This will help reduce inflammation.  Do not scratch or pick at the infected area.  Wear gloves as told by your health care provider.  Keep all follow-up visits as told by your health care provider. This is important. How is this prevented?  Wear gloves when you work with your hands.  Wash your hands often with antibacterial soap.  Avoid letting your hands stay wet or irritated for long periods of time.  Do not bite your fingernails.  Do not suck on your fingers.  Do not pull on your cuticles.  Use clean scissors or nail clippers to trim your nails. Do not cut your fingernails very short. Contact a health care provider if:  Your pain medicine is not helping.  You have more redness, swelling, or pain at your fingertip.  You continue to have fluid, blood, or pus coming from your fingertip.  Your infection area feels warm to the touch.  You continue to notice a bad smell coming from your fingertip or your dressing. Get help right away if:  The area of redness is spreading, or you notice a red streak going away from your fingertip.  You have a fever. Summary  Paronychia is an infection that happens around your nail. Paronychia infection can be caused by bacteria, funguses, or a mix  of both.  A felon infection is usually caused by the bacteria that are normally found on your skin. An infection can develop if the bacteria spread through your skin to the pad of tissue inside your fingertip.  Follow instructions from your health care provider about how to take care of the  infection.  Take or apply over-the-counter and prescription medicines only as told by your health care provider.  Contact a health care provider if you have more drainage, redness, swelling, or pain at your fingertip. This information is not intended to replace advice given to you by your health care provider. Make sure you discuss any questions you have with your health care provider. Document Revised: 01/08/2018 Document Reviewed: 01/08/2018 Elsevier Patient Education  [place].

## 2020-09-26 NOTE — MAU Note (Signed)
Pt reports to mau with c/o swollen right ring finger that is also painful.  Pt. Reports she started noticing red streaks going up her hand and arm as well.  Pt states she went to urgent care this morning and was instructed to come to MAU.  Pt also reports she called her OB and they also instructed her to come to mau.  Pt denies CTX, LOF or vag bleeding. +FM   FHR 138

## 2020-09-26 NOTE — MAU Provider Note (Addendum)
S: Patient complains today with new onset right DIP pain, mild swelling and erythema.  First noticed this yesterday but denies any obvious trauma, bug bite, gardening incident.  Assumed this was hangnail.  This morning, noticed very very faint red streak on dorsal aspect of right hand, proceeded to urgent care for evaluation.  Patient was told that she would have to go to emergency room, advised to present to MAU given term pregnancy.  Patient has no pregnancy concerns; denies vaginal bleeding, loss of fluid, contractions.  Endorses positive fetal movement.  Denies systemic signs of infection including fevers, chills, GI distress.  Still endorses full range of motion in upper extremity, denies any other swelling beyond the DIP.  Notices very little mild tenderness to palpation over aforementioned red streak.  Prenatal care complicated by morbid obesity, chronic hypertension well-controlled on medication, gestational diabetes controlled on p.o. metformin.  Patient is scheduled for primary section this Saturday given suspected LGA infant and pelvis proven to 6 pounds with significant vaginal tearing.  O: BP 135/86 (BP Location: Right Arm)   Pulse 100   Temp 98 F (36.7 C) (Oral)   Resp 18   SpO2 100%  General: NAD CV: CTAB, RRR Abd: Gravid, +FM palpated, FHT 130s MSK: RUE: ring finger DIP circumferentially mildly turgid, mild pink erythema, no obvious purulent discharge. 1cm very faint erythematous tracking along dorsum of hand terminating/fading proximal to antecubital fossa. No overlying TTP beyond dorsum of hand. Rest of MSK and skin exam WNL. Nail itself C/D/I, no evidence of raised nail bed, pitting, color change. FROM  A/P: This is a 31 year old G3 P1-0-1-1 at 105 0/7 well-dated presenting for outpatient evaluation of likely cellulitis in right hand.  Differential includes felon, possible paronychia vs early onychomycosis. Examination is not consistent with a fungal infection.  Patient has no  signs or symptoms of systemic infection.  I outlined aforementioned erythema drawn on patient arm.  Patient has antibiotic allergies.  No pregnancy concerns as mentioned in HPI.  Given third trimester gestation and close proximity to scheduled delivery, will avoid bactrim, instead discharge home with clindamycin 300mg  QID x5d.  Has clinic follow-up already with this provider this week.  Labor infectious precautions given to patient prior to discharge.

## 2020-09-30 ENCOUNTER — Ambulatory Visit (HOSPITAL_COMMUNITY)
Admission: RE | Admit: 2020-09-30 | Discharge: 2020-09-30 | Disposition: A | Discharge status: Home / Self Care | Payer: 59 | Source: Ambulatory Visit | Attending: Obstetrics and Gynecology | Admitting: Obstetrics and Gynecology

## 2020-09-30 ENCOUNTER — Other Ambulatory Visit: Payer: Self-pay

## 2020-09-30 ENCOUNTER — Other Ambulatory Visit (HOSPITAL_COMMUNITY)
Admission: RE | Admit: 2020-09-30 | Discharge: 2020-09-30 | Disposition: A | Discharge status: Home / Self Care | Payer: 59 | Source: Ambulatory Visit | Attending: Obstetrics and Gynecology | Admitting: Obstetrics and Gynecology

## 2020-09-30 ENCOUNTER — Encounter (HOSPITAL_COMMUNITY): Payer: 59

## 2020-09-30 DIAGNOSIS — Z01812 Encounter for preprocedural laboratory examination: Secondary | ICD-10-CM | POA: Insufficient documentation

## 2020-09-30 DIAGNOSIS — Z20822 Contact with and (suspected) exposure to covid-19: Secondary | ICD-10-CM | POA: Insufficient documentation

## 2020-09-30 LAB — CBC
HCT: 36.7 % (ref 36.0–46.0)
Hemoglobin: 12.5 g/dL (ref 12.0–15.0)
MCH: 32.6 pg (ref 26.0–34.0)
MCHC: 34.1 g/dL (ref 30.0–36.0)
MCV: 95.6 fL (ref 80.0–100.0)
Platelets: 166 10*3/uL (ref 150–400)
RBC: 3.84 MIL/uL — ABNORMAL LOW (ref 3.87–5.11)
RDW: 13.7 % (ref 11.5–15.5)
WBC: 6.2 10*3/uL (ref 4.0–10.5)
nRBC: 0 % (ref 0.0–0.2)

## 2020-09-30 LAB — TYPE AND SCREEN
ABO/RH(D): A POS
Antibody Screen: NEGATIVE

## 2020-09-30 LAB — SARS CORONAVIRUS 2 (TAT 6-24 HRS): SARS Coronavirus 2: NEGATIVE

## 2020-09-30 LAB — RPR: RPR Ser Ql: NONREACTIVE

## 2020-10-02 ENCOUNTER — Inpatient Hospital Stay (HOSPITAL_COMMUNITY)
Admission: RE | Admit: 2020-10-02 | Discharge: 2020-10-04 | DRG: 787 | Disposition: A | Discharge status: Home / Self Care | Payer: 59 | Attending: Obstetrics and Gynecology | Admitting: Obstetrics and Gynecology

## 2020-10-02 ENCOUNTER — Encounter (HOSPITAL_COMMUNITY): Payer: Self-pay | Admitting: Obstetrics and Gynecology

## 2020-10-02 ENCOUNTER — Encounter (HOSPITAL_COMMUNITY)
Admission: RE | Disposition: A | Discharge status: Home / Self Care | Payer: Self-pay | Source: Home / Self Care | Attending: Obstetrics and Gynecology

## 2020-10-02 ENCOUNTER — Other Ambulatory Visit: Payer: Self-pay

## 2020-10-02 ENCOUNTER — Inpatient Hospital Stay (HOSPITAL_COMMUNITY): Payer: 59 | Admitting: Certified Registered Nurse Anesthetist

## 2020-10-02 DIAGNOSIS — Z20822 Contact with and (suspected) exposure to covid-19: Secondary | ICD-10-CM | POA: Diagnosis present

## 2020-10-02 DIAGNOSIS — O99214 Obesity complicating childbirth: Secondary | ICD-10-CM | POA: Diagnosis present

## 2020-10-02 DIAGNOSIS — O321XX0 Maternal care for breech presentation, not applicable or unspecified: Secondary | ICD-10-CM | POA: Diagnosis present

## 2020-10-02 DIAGNOSIS — N83201 Unspecified ovarian cyst, right side: Secondary | ICD-10-CM | POA: Diagnosis present

## 2020-10-02 DIAGNOSIS — O1002 Pre-existing essential hypertension complicating childbirth: Secondary | ICD-10-CM | POA: Diagnosis present

## 2020-10-02 DIAGNOSIS — O3483 Maternal care for other abnormalities of pelvic organs, third trimester: Secondary | ICD-10-CM | POA: Diagnosis present

## 2020-10-02 DIAGNOSIS — Z3A37 37 weeks gestation of pregnancy: Secondary | ICD-10-CM | POA: Diagnosis not present

## 2020-10-02 DIAGNOSIS — O24425 Gestational diabetes mellitus in childbirth, controlled by oral hypoglycemic drugs: Secondary | ICD-10-CM | POA: Diagnosis present

## 2020-10-02 LAB — GLUCOSE, CAPILLARY
Glucose-Capillary: 105 mg/dL — ABNORMAL HIGH (ref 70–99)
Glucose-Capillary: 125 mg/dL — ABNORMAL HIGH (ref 70–99)
Glucose-Capillary: 125 mg/dL — ABNORMAL HIGH (ref 70–99)

## 2020-10-02 SURGERY — Surgical Case
Anesthesia: Spinal

## 2020-10-02 MED ORDER — KETOROLAC TROMETHAMINE 30 MG/ML IJ SOLN
INTRAMUSCULAR | Status: AC
  Filled 2020-10-02: qty 1

## 2020-10-02 MED ORDER — ACETAMINOPHEN 500 MG PO TABS
1000.0000 mg | ORAL_TABLET | Freq: Four times a day (QID) | ORAL | Status: DC
  Administered 2020-10-03 – 2020-10-04 (×4): 1000 mg via ORAL
  Filled 2020-10-02 (×6): qty 2

## 2020-10-02 MED ORDER — DIPHENHYDRAMINE HCL 25 MG PO CAPS: 25.0000 mg | ORAL_CAPSULE | ORAL | Status: DC | PRN

## 2020-10-02 MED ORDER — DIPHENHYDRAMINE HCL 50 MG/ML IJ SOLN: 12.5000 mg | INTRAMUSCULAR | Status: DC | PRN

## 2020-10-02 MED ORDER — OXYTOCIN-SODIUM CHLORIDE 30-0.9 UT/500ML-% IV SOLN
INTRAVENOUS | Status: AC
  Filled 2020-10-02: qty 500

## 2020-10-02 MED ORDER — PRENATAL MULTIVITAMIN CH
1.0000 | ORAL_TABLET | Freq: Every day | ORAL | Status: DC
  Administered 2020-10-03 – 2020-10-04 (×2): 1 via ORAL
  Filled 2020-10-02 (×2): qty 1

## 2020-10-02 MED ORDER — PHENYLEPHRINE 40 MCG/ML (10ML) SYRINGE FOR IV PUSH (FOR BLOOD PRESSURE SUPPORT)
PREFILLED_SYRINGE | INTRAVENOUS | Status: AC
  Filled 2020-10-02: qty 10

## 2020-10-02 MED ORDER — KETOROLAC TROMETHAMINE 30 MG/ML IJ SOLN: 30.0000 mg | Freq: Four times a day (QID) | INTRAMUSCULAR | Status: DC | PRN

## 2020-10-02 MED ORDER — PROMETHAZINE HCL 25 MG/ML IJ SOLN: 6.2500 mg | INTRAMUSCULAR | Status: DC | PRN

## 2020-10-02 MED ORDER — MORPHINE SULFATE (PF) 0.5 MG/ML IJ SOLN: INTRAMUSCULAR | Status: DC | PRN

## 2020-10-02 MED ORDER — MENTHOL 3 MG MT LOZG: 1.0000 | LOZENGE | OROMUCOSAL | Status: DC | PRN

## 2020-10-02 MED ORDER — STERILE WATER FOR IRRIGATION IR SOLN
Status: DC | PRN
  Administered 2020-10-02: 1000 mL

## 2020-10-02 MED ORDER — ENOXAPARIN SODIUM 60 MG/0.6ML IJ SOSY
60.0000 mg | PREFILLED_SYRINGE | INTRAMUSCULAR | Status: DC
  Administered 2020-10-03 – 2020-10-04 (×2): 60 mg via SUBCUTANEOUS
  Filled 2020-10-02 (×2): qty 0.6

## 2020-10-02 MED ORDER — PHENYLEPHRINE HCL-NACL 20-0.9 MG/250ML-% IV SOLN
INTRAVENOUS | Status: DC | PRN
  Administered 2020-10-02: 60 ug/min via INTRAVENOUS

## 2020-10-02 MED ORDER — ALBUMIN HUMAN 5 % IV SOLN: INTRAVENOUS | Status: DC | PRN

## 2020-10-02 MED ORDER — NIFEDIPINE ER OSMOTIC RELEASE 30 MG PO TB24
60.0000 mg | ORAL_TABLET | Freq: Every day | ORAL | Status: DC
  Administered 2020-10-04: 30 mg via ORAL
  Filled 2020-10-02: qty 2

## 2020-10-02 MED ORDER — MORPHINE SULFATE (PF) 0.5 MG/ML IJ SOLN
INTRAMUSCULAR | Status: DC | PRN
  Administered 2020-10-02: .15 mg via INTRATHECAL

## 2020-10-02 MED ORDER — HYDROMORPHONE HCL 1 MG/ML IJ SOLN: 0.2500 mg | INTRAMUSCULAR | Status: DC | PRN

## 2020-10-02 MED ORDER — TRANEXAMIC ACID-NACL 1000-0.7 MG/100ML-% IV SOLN
INTRAVENOUS | Status: DC | PRN
  Administered 2020-10-02: 1000 mg via INTRAVENOUS

## 2020-10-02 MED ORDER — OXYTOCIN-SODIUM CHLORIDE 30-0.9 UT/500ML-% IV SOLN
INTRAVENOUS | Status: DC | PRN
  Administered 2020-10-02: 300 mL via INTRAVENOUS

## 2020-10-02 MED ORDER — ACETAMINOPHEN 500 MG PO TABS
1000.0000 mg | ORAL_TABLET | Freq: Four times a day (QID) | ORAL | Status: AC
  Administered 2020-10-02 – 2020-10-03 (×4): 1000 mg via ORAL
  Filled 2020-10-02 (×3): qty 2

## 2020-10-02 MED ORDER — TETANUS-DIPHTH-ACELL PERTUSSIS 5-2.5-18.5 LF-MCG/0.5 IM SUSY: 0.5000 mL | PREFILLED_SYRINGE | Freq: Once | INTRAMUSCULAR | Status: DC

## 2020-10-02 MED ORDER — DIPHENHYDRAMINE HCL 25 MG PO CAPS: 25.0000 mg | ORAL_CAPSULE | Freq: Four times a day (QID) | ORAL | Status: DC | PRN

## 2020-10-02 MED ORDER — LACTATED RINGERS IV SOLN: INTRAVENOUS | Status: DC

## 2020-10-02 MED ORDER — MORPHINE SULFATE (PF) 0.5 MG/ML IJ SOLN
INTRAMUSCULAR | Status: AC
  Filled 2020-10-02: qty 10

## 2020-10-02 MED ORDER — SIMETHICONE 80 MG PO CHEW: 80.0000 mg | CHEWABLE_TABLET | ORAL | Status: DC | PRN

## 2020-10-02 MED ORDER — SOD CITRATE-CITRIC ACID 500-334 MG/5ML PO SOLN
ORAL | Status: AC
  Filled 2020-10-02: qty 30

## 2020-10-02 MED ORDER — TRANEXAMIC ACID-NACL 1000-0.7 MG/100ML-% IV SOLN
INTRAVENOUS | Status: AC
  Filled 2020-10-02: qty 100

## 2020-10-02 MED ORDER — NALBUPHINE HCL 10 MG/ML IJ SOLN: 5.0000 mg | INTRAMUSCULAR | Status: DC | PRN

## 2020-10-02 MED ORDER — ONDANSETRON HCL 4 MG/2ML IJ SOLN
INTRAMUSCULAR | Status: DC | PRN
  Administered 2020-10-02: 4 mg via INTRAVENOUS

## 2020-10-02 MED ORDER — CEFAZOLIN IN SODIUM CHLORIDE 3-0.9 GM/100ML-% IV SOLN
INTRAVENOUS | Status: AC
  Filled 2020-10-02: qty 100

## 2020-10-02 MED ORDER — PHENYLEPHRINE HCL-NACL 20-0.9 MG/250ML-% IV SOLN
INTRAVENOUS | Status: AC
  Filled 2020-10-02: qty 250

## 2020-10-02 MED ORDER — SCOPOLAMINE 1 MG/3DAYS TD PT72
MEDICATED_PATCH | TRANSDERMAL | Status: AC
  Filled 2020-10-02: qty 1

## 2020-10-02 MED ORDER — ONDANSETRON HCL 4 MG/2ML IJ SOLN
INTRAMUSCULAR | Status: AC
  Filled 2020-10-02: qty 2

## 2020-10-02 MED ORDER — NALBUPHINE HCL 10 MG/ML IJ SOLN: 5.0000 mg | Freq: Once | INTRAMUSCULAR | Status: DC | PRN

## 2020-10-02 MED ORDER — NALOXONE HCL 0.4 MG/ML IJ SOLN: 0.4000 mg | INTRAMUSCULAR | Status: DC | PRN

## 2020-10-02 MED ORDER — DIBUCAINE (PERIANAL) 1 % EX OINT
1.0000 | TOPICAL_OINTMENT | CUTANEOUS | Status: DC | PRN
Start: 2020-10-02 — End: 2020-10-04

## 2020-10-02 MED ORDER — OXYTOCIN-SODIUM CHLORIDE 30-0.9 UT/500ML-% IV SOLN: 2.5000 [IU]/h | INTRAVENOUS | Status: AC

## 2020-10-02 MED ORDER — ONDANSETRON HCL 4 MG/2ML IJ SOLN
4.0000 mg | Freq: Three times a day (TID) | INTRAMUSCULAR | Status: DC | PRN
  Administered 2020-10-02: 4 mg via INTRAVENOUS
  Filled 2020-10-02: qty 2

## 2020-10-02 MED ORDER — ZOLPIDEM TARTRATE 5 MG PO TABS
5.0000 mg | ORAL_TABLET | Freq: Every evening | ORAL | Status: DC | PRN
Start: 2020-10-02 — End: 2020-10-04

## 2020-10-02 MED ORDER — WITCH HAZEL-GLYCERIN EX PADS
1.0000 | MEDICATED_PAD | CUTANEOUS | Status: DC | PRN
Start: 2020-10-02 — End: 2020-10-04

## 2020-10-02 MED ORDER — SOD CITRATE-CITRIC ACID 500-334 MG/5ML PO SOLN
30.0000 mL | Freq: Once | ORAL | Status: AC
  Administered 2020-10-02: 30 mL via ORAL

## 2020-10-02 MED ORDER — FENTANYL CITRATE (PF) 100 MCG/2ML IJ SOLN
INTRAMUSCULAR | Status: AC
  Filled 2020-10-02: qty 2

## 2020-10-02 MED ORDER — ALBUMIN HUMAN 5 % IV SOLN
INTRAVENOUS | Status: AC
  Filled 2020-10-02: qty 250

## 2020-10-02 MED ORDER — SCOPOLAMINE 1 MG/3DAYS TD PT72
MEDICATED_PATCH | TRANSDERMAL | Status: DC | PRN
  Administered 2020-10-02: 1 via TRANSDERMAL

## 2020-10-02 MED ORDER — PHENYLEPHRINE 40 MCG/ML (10ML) SYRINGE FOR IV PUSH (FOR BLOOD PRESSURE SUPPORT)
PREFILLED_SYRINGE | INTRAVENOUS | Status: DC | PRN
  Administered 2020-10-02 (×2): 120 ug via INTRAVENOUS

## 2020-10-02 MED ORDER — POVIDONE-IODINE 10 % EX SWAB
2.0000 | Freq: Once | CUTANEOUS | Status: AC
Start: 2020-10-02 — End: 2020-10-02
  Administered 2020-10-02: 2 via TOPICAL

## 2020-10-02 MED ORDER — FENTANYL CITRATE (PF) 100 MCG/2ML IJ SOLN: INTRAMUSCULAR | Status: DC | PRN

## 2020-10-02 MED ORDER — SENNOSIDES-DOCUSATE SODIUM 8.6-50 MG PO TABS
2.0000 | ORAL_TABLET | Freq: Every day | ORAL | Status: DC
  Administered 2020-10-03 – 2020-10-04 (×2): 2 via ORAL
  Filled 2020-10-02 (×2): qty 2

## 2020-10-02 MED ORDER — BUPIVACAINE IN DEXTROSE 0.75-8.25 % IT SOLN
INTRATHECAL | Status: DC | PRN
  Administered 2020-10-02: 1.4 mg via INTRATHECAL

## 2020-10-02 MED ORDER — KETOROLAC TROMETHAMINE 30 MG/ML IJ SOLN
30.0000 mg | Freq: Once | INTRAMUSCULAR | Status: AC | PRN
  Administered 2020-10-02: 30 mg via INTRAVENOUS

## 2020-10-02 MED ORDER — MEPERIDINE HCL 25 MG/ML IJ SOLN: 6.2500 mg | INTRAMUSCULAR | Status: DC | PRN

## 2020-10-02 MED ORDER — SODIUM CHLORIDE 0.9 % IR SOLN
Status: DC | PRN
  Administered 2020-10-02: 1

## 2020-10-02 MED ORDER — IBUPROFEN 600 MG PO TABS
600.0000 mg | ORAL_TABLET | Freq: Four times a day (QID) | ORAL | Status: DC
  Administered 2020-10-03 – 2020-10-04 (×5): 600 mg via ORAL
  Filled 2020-10-02 (×5): qty 1

## 2020-10-02 MED ORDER — DEXAMETHASONE SODIUM PHOSPHATE 10 MG/ML IJ SOLN
INTRAMUSCULAR | Status: DC | PRN
  Administered 2020-10-02: 5 mg via INTRAVENOUS

## 2020-10-02 MED ORDER — CEFAZOLIN IN SODIUM CHLORIDE 3-0.9 GM/100ML-% IV SOLN
3.0000 g | INTRAVENOUS | Status: AC
  Administered 2020-10-02: 3 g via INTRAVENOUS

## 2020-10-02 MED ORDER — SCOPOLAMINE 1 MG/3DAYS TD PT72: 1.0000 | MEDICATED_PATCH | Freq: Once | TRANSDERMAL | Status: DC

## 2020-10-02 MED ORDER — NALOXONE HCL 4 MG/10ML IJ SOLN
1.0000 ug/kg/h | INTRAVENOUS | Status: DC | PRN
  Filled 2020-10-02: qty 5

## 2020-10-02 MED ORDER — SIMETHICONE 80 MG PO CHEW
80.0000 mg | CHEWABLE_TABLET | Freq: Three times a day (TID) | ORAL | Status: DC
  Administered 2020-10-02 – 2020-10-04 (×5): 80 mg via ORAL
  Filled 2020-10-02 (×5): qty 1

## 2020-10-02 MED ORDER — COCONUT OIL OIL: 1.0000 "application " | TOPICAL_OIL | Status: DC | PRN

## 2020-10-02 MED ORDER — FENTANYL CITRATE (PF) 100 MCG/2ML IJ SOLN
INTRAMUSCULAR | Status: DC | PRN
  Administered 2020-10-02: 15 ug via INTRATHECAL

## 2020-10-02 MED ORDER — KETOROLAC TROMETHAMINE 30 MG/ML IJ SOLN
30.0000 mg | Freq: Four times a day (QID) | INTRAMUSCULAR | Status: AC
  Administered 2020-10-02 – 2020-10-03 (×3): 30 mg via INTRAVENOUS
  Filled 2020-10-02 (×3): qty 1

## 2020-10-02 MED ORDER — OXYCODONE HCL 5 MG PO TABS: 5.0000 mg | ORAL_TABLET | ORAL | Status: DC | PRN

## 2020-10-02 MED ORDER — SODIUM CHLORIDE 0.9% FLUSH: 3.0000 mL | INTRAVENOUS | Status: DC | PRN

## 2020-10-02 SURGICAL SUPPLY — 39 items
CHLORAPREP W/TINT 26ML (MISCELLANEOUS) ×2 IMPLANT
CLAMP CORD UMBIL (MISCELLANEOUS) IMPLANT
CLOTH BEACON ORANGE TIMEOUT ST (SAFETY) ×2 IMPLANT
DERMABOND ADHESIVE PROPEN (GAUZE/BANDAGES/DRESSINGS) ×1
DERMABOND ADVANCED (GAUZE/BANDAGES/DRESSINGS) ×1
DERMABOND ADVANCED .7 DNX12 (GAUZE/BANDAGES/DRESSINGS) ×1 IMPLANT
DERMABOND ADVANCED .7 DNX6 (GAUZE/BANDAGES/DRESSINGS) ×1 IMPLANT
DRSG OPSITE POSTOP 4X10 (GAUZE/BANDAGES/DRESSINGS) ×2 IMPLANT
ELECT REM PT RETURN 9FT ADLT (ELECTROSURGICAL) ×2
ELECTRODE REM PT RTRN 9FT ADLT (ELECTROSURGICAL) ×1 IMPLANT
EXTRACTOR VACUUM BELL STYLE (SUCTIONS) IMPLANT
GAUZE SPONGE 4X4 12PLY STRL LF (GAUZE/BANDAGES/DRESSINGS) IMPLANT
GLOVE BIO SURGEON STRL SZ 6 (GLOVE) ×2 IMPLANT
GLOVE BIOGEL PI IND STRL 6.5 (GLOVE) ×1 IMPLANT
GLOVE BIOGEL PI IND STRL 7.0 (GLOVE) ×2 IMPLANT
GLOVE BIOGEL PI INDICATOR 6.5 (GLOVE) ×1
GLOVE BIOGEL PI INDICATOR 7.0 (GLOVE) ×2
GOWN STRL REUS W/TWL LRG LVL3 (GOWN DISPOSABLE) ×4 IMPLANT
KIT ABG SYR 3ML LUER SLIP (SYRINGE) IMPLANT
NEEDLE HYPO 25X5/8 SAFETYGLIDE (NEEDLE) IMPLANT
NS IRRIG 1000ML POUR BTL (IV SOLUTION) ×2 IMPLANT
PACK C SECTION WH (CUSTOM PROCEDURE TRAY) ×2 IMPLANT
PAD ABD 8X10 STRL (GAUZE/BANDAGES/DRESSINGS) IMPLANT
PAD OB MATERNITY 4.3X12.25 (PERSONAL CARE ITEMS) ×2 IMPLANT
PENCIL SMOKE EVAC W/HOLSTER (ELECTROSURGICAL) ×2 IMPLANT
RTRCTR C-SECT PINK 25CM LRG (MISCELLANEOUS) IMPLANT
SUT PDS AB 0 CTX 36 PDP370T (SUTURE) IMPLANT
SUT PLAIN 2 0 (SUTURE)
SUT PLAIN 2 0 XLH (SUTURE) ×2 IMPLANT
SUT PLAIN ABS 2-0 CT1 27XMFL (SUTURE) IMPLANT
SUT VIC AB 0 CT1 36 (SUTURE) ×6 IMPLANT
SUT VIC AB 2-0 CT1 27 (SUTURE) ×1
SUT VIC AB 2-0 CT1 TAPERPNT 27 (SUTURE) ×1 IMPLANT
SUT VIC AB 3-0 SH 27 (SUTURE)
SUT VIC AB 3-0 SH 27X BRD (SUTURE) IMPLANT
SUT VIC AB 4-0 KS 27 (SUTURE) ×2 IMPLANT
TOWEL OR 17X24 6PK STRL BLUE (TOWEL DISPOSABLE) ×2 IMPLANT
TRAY FOLEY W/BAG SLVR 14FR LF (SET/KITS/TRAYS/PACK) ×2 IMPLANT
WATER STERILE IRR 1000ML POUR (IV SOLUTION) ×2 IMPLANT

## 2020-10-02 NOTE — Anesthesia Postprocedure Evaluation (Signed)
Anesthesia Post Note  Patient: Sheryl Velasquez  Procedure(s) Performed: [place]     Patient location during evaluation: PACU Anesthesia Type: Spinal Level of consciousness: awake Pain management: pain level controlled Vital Signs Assessment: post-procedure vital signs reviewed and stable Respiratory status: spontaneous breathing Cardiovascular status: stable Postop Assessment: no headache, no backache, spinal receding, patient able to bend at knees and no apparent nausea or vomiting Anesthetic complications: no   No notable events documented.  Last Vitals:  Vitals:   10/02/20 1010 10/02/20 1015  BP:  118/79  Pulse: 83 76  Resp: (!) 21 12  Temp:    SpO2: 99% 98%    Last Pain:  Vitals:   10/02/20 1015  TempSrc:   PainSc: 0-No pain   Pain Goal:                Epidural/Spinal Function Cutaneous sensation: Able to Wiggle Toes (10/02/20 1015), Patient able to flex knees: Yes (10/02/20 1015), Patient able to lift hips off bed: Yes (10/02/20 1015), Back pain beyond tenderness at insertion site: No (10/02/20 1015), Progressively worsening motor and/or sensory loss: No (10/02/20 1015), Bowel and/or bladder incontinence post epidural: No (10/02/20 1015)  Huston Foley

## 2020-10-02 NOTE — Op Note (Signed)
C-Section Operative Note  Date: 10/02/20  Preoperative Diagnosis: IUP @ 37 6/7, CHTN on meds, GDMA2, BMI 48 Postoperative Diagnosis: Same as above, right ovarian cyst Procedure: PLTCS, right ovarian cyst aspiration Indications: Breech presentation, symptomatic right ovarian cyst Findings: Viable female infant with APGARS of 9 and 9 at 1 and 5 minutes, respectively. Normal appearing uterus, bilateral fallopian tubes. Left ovary WNL. Right ovary approx 7cm, internal septation palpable; smooth overall surface. Clear, serous fluid aspirated (approx 150cc), normal appearing stroma. Specimens: Placenta; ovarian cyst aspirate EBL 850 IVF 2300LR plus 250 albumin UOP 200cc  Patient Course: Patient admitted for scheduled PLTCS for fetal malpresentation (confirmed day of surgery, declined ECV) in setting of CHTN on meds and GDMA2. Had symptomatic right ovarian cyst, reviewed possibility of aspiration vs cystectomy vs no surgical management  Consent:  R/B/A of cesarean section discussed with patient. Alternative would be vaginal delivery which would mean shorter postpartum stay and decreased risk of bleeding. Risks of section include infection of the uterus, pelvic organs, or skin, inadvertent injury to internal organs, such as bowel or bladder. If there is major injury, extensive surgery may be required. If injury is minor, it may be treated with relative ease. Discussed possibility of excessive blood loss and transfusion. If bleeding cannot be controlled using medical or minor surgical methods, a cesarean hysterectomy may be performed which would mean no future fertility. Patient accepts the possibility of blood transfusion, if necessary. Patient understands and agrees to move forward with section.   Operative Procedure: Patient was taken to the operating room where epidural anesthesia was found to be adequate by Allis clamp test. She was prepped and draped in the normal sterile fashion in the dorsal supine  position with a leftward tilt. 3g Ancef given from BMI. TRAXI retractor placed on abdomen for appropriate visualization. An appropriate time out was performed. A Pfannenstiel skin incision was then made with the scalpel and carried through to the underlying layer of fascia by sharp dissection and Bovie cautery. The fascia was nicked in the midline and the incision was extended laterally with Mayo scissors. The superior aspect of the incision was grasped Coker clamps and dissected off the underlying rectus muscles. In a similar fashion the inferior aspect was dissected off the rectus muscles. Rectus muscles were separated in the midline and the peritoneal cavity entered bluntly. The peritoneal incision was then extended both superiorly and inferiorly with careful attention to avoid both bowel and bladder. The Alexis self-retaining wound retractor was then placed within the incision and the lower uterine segment exposed. The bladder flap was developed with Metzenbaum scissors and pushed away from the lower uterine segment. The lower uterine segment was then incised in a low transverse fashion and the cavity itself entered bluntly. The incision was extended bluntly. Amniotic sac was ruptured and fluid was noted to be  in color. Fetal buttocks visible, LE extended consistent with frank breech. With gentle pressure, fetal buttocks delivered followed by BL LE via Pinard maneuver. Towel used to grasp fetal torso until BL scalpula visible. Using MSV maneuver, fetal head delivered without issue.The infant was handed off to NICU. The placenta was then spontaneously expressed from the uterus and the uterus cleared of all clots and debris with moist lap sponge. The uterine incision was then repaired in 2 layers the first layer was a running locked layer of 0-vicryl and the second an imbricating layer of the same suture. The tubes and ovaries were inspected and the gutters cleared of all clots  and debris. The uterine incision  was inspected and found to be hemostatic.   Attention was then turned to right ovarian cyst as mentioned in findings. 18g spinal needle used to aspirate clear, serous fluid from palpable internal septations in cyst. Unable to clearly discern solitary cyst from normal ovarian tissue hence cystectomy was not performed.   All instruments and sponges as well as the Alexis retractor were then removed from the abdomen. The rectus muscles were then reapproximated with several interrupted mattress sutures of 2-0 Vicryl. The fascia was then closed with 0 PDS in a running fashion. Subcutaneous tissue was reapproximated with 3-0 plain in a running fashion. The skin was closed with a subcuticular stitch of 4-0 Vicryl on a Keith needle and then reinforced with Dermabond and a Honeycomb. At the conclusion of the procedure all instruments and sponge counts were correct. Patient was taken to the recovery room in good condition with her baby accompanying her skin to skin.

## 2020-10-02 NOTE — H&P (Signed)
Sheryl Velasquez is a 30 y.o. female presenting for scheduled procedure. +FM, denies VB, LOF, CTX.  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 Last GS 3)  PCOS and morbid obesity - conceived on PO metformin, baseline A1c 5.2, BMI 49 4) Steatosis of liver -s/p lap chole in teens, follows w/ GI (Dr Collene Mares) for elevated LFTs > resolved by 19wks (30s) > normal this admission 5) Rt ovarian cyst - 6cm, septated, found on admission on 5/24 with RLQ pain, initially presumed to be kidney stone. CT imaging confirmed 6cm w/ septations, no grossly concerning features and normal Doppler  Declined genetics, baby boy  OB History     Gravida  3   Para  1   Term  1   Preterm      AB  1   Living  1      SAB  1   IAB      Ectopic      Multiple      Live Births  1          Past Medical History:  Diagnosis Date   Diabetes mellitus without complication ([place])    Fatty liver    Gestational diabetes    Hypertension    PCOS (polycystic ovarian syndrome)    Past Surgical History:  Procedure Laterality Date   CHOLECYSTECTOMY  2015   WISDOM TOOTH EXTRACTION  2011   Family History: family history includes Diabetes in her mother; Hypertension in her father and mother. Social History:  reports that she has never smoked. She has never used smokeless tobacco. She reports that she does not drink alcohol and does not use drugs.     Maternal Diabetes: Yes:  Diabetes Type:  Insulin/Medication controlled Genetic Screening: Declined Maternal Ultrasounds/Referrals: Normal Fetal Ultrasounds or other Referrals:  None Maternal Substance Abuse:  No Significant Maternal Medications:  None Significant Maternal Lab Results:  Group B Strep negative Other Comments:  None  Review of Systems  Constitutional:  Negative for chills and fever.  Respiratory:  Negative for shortness of  breath.   Cardiovascular:  Negative for chest pain, palpitations and leg swelling.  Gastrointestinal:  Negative for abdominal pain and vomiting.  Neurological:  Negative for dizziness, weakness and headaches.  Psychiatric/Behavioral:  Negative for suicidal ideas.   Maternal Medical History:  Contractions: Frequency: rare.   Fetal activity: Perceived fetal activity is normal.   Prenatal complications: PIH.   Prenatal Complications - Diabetes: gestational. Diabetes is managed by oral agent (monotherapy).      Blood pressure 121/85, pulse 80, temperature 98.6 F (37 C), temperature source Oral, resp. rate 20, height 5\' 2"  (1.575 m), weight 119.7 kg, SpO2 100 %. Exam Physical Exam Constitutional:      General: She is not in acute distress.    Appearance: She is well-developed.  HENT:     Head: Normocephalic and atraumatic.  Eyes:     Pupils: Pupils are equal, round, and reactive to light.  Cardiovascular:     Rate and Rhythm: Normal rate and regular rhythm.     Heart sounds: No murmur heard.   No gallop.  Abdominal:     Tenderness: There is no abdominal tenderness. There is no guarding or rebound.  Genitourinary:    Vagina: Normal.  Musculoskeletal:        General: Normal range of motion.  Cervical back: Normal range of motion and neck supple.  Skin:    General: Skin is warm and dry.  Neurological:     Mental Status: She is alert and oriented to person, place, and time.    Prenatal labs: ABO, Rh: --/--/A POS (06/09 3202) Antibody: NEG (06/09 0927) Rubella:   RPR: NON REACTIVE (06/09 0920)  HBsAg:   neg HIV:   nr GBS:   neg  Assessment/Plan: This is a 30yo G3P1011 @ 5 6/7 by 7wk TVUS NTO c/w LMP admitted for scheduled PLTCS for fetal malpresentation in setting of CHTN on meds and GDMA2. Last GS on 6/4 showed EFW 86%tile or 7lb12oz with AC >99th%tile. Patient aware NICU may be monitoring BG postpartum. Reviewed that we may aspirate ovarian cyst vs perform cystectomy  depending on surrounding anatomy, May also postpone until postpartum period. R/B/A of cesarean section discussed with patient. Alternative would be vaginal delivery which would mean shorter postpartum stay and decreased risk of bleeding. Risks of section include infection of the uterus, pelvic organs, or skin, inadvertent injury to internal organs, such as bowel or bladder. If there is major injury, extensive surgery may be required. If injury is minor, it may be treated with relative ease. Discussed possibility of excessive blood loss and transfusion. If bleeding cannot be controlled using medical or minor surgical methods, a cesarean hysterectomy may be performed which would mean no future fertility. Patient accepts the possibility of blood transfusion, if necessary. Patient understands and agrees to move forward with section.    [place] 10/02/2020, 7:35 AM

## 2020-10-02 NOTE — Transfer of Care (Signed)
Immediate Anesthesia Transfer of Care Note  Patient: Sheryl Velasquez  Procedure(s) Performed: CESAREAN SECTION  Patient Location: PACU  Anesthesia Type:Spinal  Level of Consciousness: awake  Airway & Oxygen Therapy: Patient Spontanous Breathing  Post-op Assessment: Report given to RN  Post vital signs: Reviewed and stable  Last Vitals:  Vitals Value Taken Time  BP 119/105 10/02/20 0930  Temp    Pulse 87 10/02/20 0931  Resp 16 10/02/20 0931  SpO2 100 % 10/02/20 0931  Vitals shown include unvalidated device data.  Last Pain:  Vitals:   10/02/20 0556  TempSrc: Oral  PainSc: 0-No pain         Complications: No notable events documented.

## 2020-10-02 NOTE — Progress Notes (Signed)
Subjective: Postpartum Day  0: Cesarean Delivery Patient reports no complaints. Just made it to postpartum room from post op recovery; settling in. Has no complaints. Confirms desire for circumcision   Objective: Vital signs in last 24 hours: Temp:  [97.9 F (36.6 C)-98.6 F (37 C)] 97.9 F (36.6 C) (06/11 1000) Pulse Rate:  [76-91] 76 (06/11 1015) Resp:  [12-33] 12 (06/11 1015) BP: (105-121)/(58-105) 118/79 (06/11 1015) SpO2:  [98 %-100 %] 98 % (06/11 1015) Weight:  [119.7 kg] 119.7 kg (06/11 0556)  Physical Exam:  General: alert, cooperative, and no distress Lochia: appropriate Uterine Fundus: firm Incision: no significant drainage DVT Evaluation: SCDs in place  Recent Labs    09/30/20 0920  HGB 12.5  HCT 36.7    Assessment/Plan: Status post Cesarean section. Doing well postoperatively.  Continue current care Circumcision tomorrow if baby cleared.  Isaiah Serge 10/02/2020, 10:50 AM

## 2020-10-02 NOTE — Anesthesia Preprocedure Evaluation (Signed)
Anesthesia Evaluation  Patient identified by MRN, date of birth, ID band Patient awake    Reviewed: Allergy & Precautions, H&P , NPO status , Patient's Chart, lab work & pertinent test results  Airway Mallampati: III       Dental no notable dental hx.    Pulmonary neg pulmonary ROS,    Pulmonary exam normal        Cardiovascular hypertension, Normal cardiovascular exam     Neuro/Psych negative neurological ROS  negative psych ROS   GI/Hepatic negative GI ROS, Neg liver ROS,   Endo/Other  diabetes, Gestational, Oral Hypoglycemic AgentsMorbid obesity  Renal/GU negative Renal ROS  negative genitourinary   Musculoskeletal negative musculoskeletal ROS (+)   Abdominal (+) + obese,   Peds  Hematology negative hematology ROS (+)   Anesthesia Other Findings   Reproductive/Obstetrics (+) Pregnancy                             Anesthesia Physical Anesthesia Plan  ASA: 3  Anesthesia Plan: Spinal   Post-op Pain Management:    Induction:   PONV Risk Score and Plan: Ondansetron, Scopolamine patch - Pre-op and Treatment may vary due to age or medical condition  Airway Management Planned: Nasal Cannula, Natural Airway and Simple Face Mask  Additional Equipment: None  Intra-op Plan:   Post-operative Plan:   Informed Consent: I have reviewed the patients History and Physical, chart, labs and discussed the procedure including the risks, benefits and alternatives for the proposed anesthesia with the patient or authorized representative who has indicated his/her understanding and acceptance.       Plan Discussed with: CRNA  Anesthesia Plan Comments:         Anesthesia Quick Evaluation

## 2020-10-02 NOTE — Anesthesia Procedure Notes (Signed)
Spinal  Patient location during procedure: OR Start time: 10/02/2020 7:52 AM End time: 10/02/2020 7:55 AM Reason for block: surgical anesthesia Staffing Performed: anesthesiologist  Anesthesiologist: Lyn Hollingshead, MD Preanesthetic Checklist Completed: patient identified, IV checked, site marked, risks and benefits discussed, surgical consent, monitors and equipment checked, pre-op evaluation and timeout performed Spinal Block Patient position: sitting Prep: DuraPrep and site prepped and draped Patient monitoring: continuous pulse ox and blood pressure Approach: midline Location: L3-4 Injection technique: single-shot Needle Needle type: Pencan  Needle gauge: 24 G Needle length: 10 cm Needle insertion depth: 6 cm Assessment Sensory level: T2 Events: CSF return

## 2020-10-03 LAB — CBC
HCT: 25.1 % — ABNORMAL LOW (ref 36.0–46.0)
Hemoglobin: 8.5 g/dL — ABNORMAL LOW (ref 12.0–15.0)
MCH: 32.9 pg (ref 26.0–34.0)
MCHC: 33.9 g/dL (ref 30.0–36.0)
MCV: 97.3 fL (ref 80.0–100.0)
Platelets: 130 10*3/uL — ABNORMAL LOW (ref 150–400)
RBC: 2.58 MIL/uL — ABNORMAL LOW (ref 3.87–5.11)
RDW: 13.9 % (ref 11.5–15.5)
WBC: 10 10*3/uL (ref 4.0–10.5)
nRBC: 0 % (ref 0.0–0.2)

## 2020-10-03 LAB — GLUCOSE, CAPILLARY
Glucose-Capillary: 105 mg/dL — ABNORMAL HIGH (ref 70–99)
Glucose-Capillary: 77 mg/dL (ref 70–99)
Glucose-Capillary: 120 mg/dL — ABNORMAL HIGH (ref 70–99)
Glucose-Capillary: 135 mg/dL — ABNORMAL HIGH (ref 70–99)

## 2020-10-03 LAB — CREATININE, SERUM
Creatinine, Ser: 0.69 mg/dL (ref 0.44–1.00)
GFR, Estimated: >60 mL/min (ref >60)

## 2020-10-03 MED ORDER — NIFEDIPINE ER OSMOTIC RELEASE 30 MG PO TB24
30.0000 mg | ORAL_TABLET | Freq: Once | ORAL | Status: AC
  Administered 2020-10-03: 30 mg via ORAL
  Filled 2020-10-03: qty 1

## 2020-10-03 MED ORDER — FERROUS SULFATE 325 (65 FE) MG PO TABS
325.0000 mg | ORAL_TABLET | Freq: Every day | ORAL | Status: DC
  Administered 2020-10-03 – 2020-10-04 (×2): 325 mg via ORAL
  Filled 2020-10-03 (×2): qty 1

## 2020-10-03 NOTE — Progress Notes (Signed)
Subjective: Postpartum Day 1: Cesarean Delivery Patient reports tolerating PO, + flatus, and no problems voiding.  Pain well controlled. She denies any lightheadedness, HA, CP or SOB. Feels great. She has ambulated well. Breastfeeding now. No complaints.  BP medication - procardia 60xl - was apparently held last night due to "low BP". Pt notes she was on 30xl procardia qhs prior to pregnancy  Objective: Vital signs in last 24 hours: Temp:  [97.9 F (36.6 C)-98.4 F (36.9 C)] 97.9 F (36.6 C) (06/12 0609) Pulse Rate:  [72-91] 83 (06/12 0609) Resp:  [12-33] 18 (06/12 0609) BP: (112-129)/(60-84) 116/68 (06/12 0609) SpO2:  [96 %-100 %] 99 % (06/12 0609)  Physical Exam:  General: alert, cooperative, and no distress Lochia: appropriate Uterine Fundus: firm Incision: no significant drainage DVT Evaluation: No evidence of DVT seen on physical exam.  Recent Labs    10/03/20 0509  HGB 8.5*  HCT 25.1*    Assessment/Plan: Status post Cesarean section. Doing well postoperatively.  Continue current care: I requested a phone call to notify me of her BP trend prior to 10pm tonight when her procardia should be due. Discussed possible need to decrease back to 30xl. Will perform circumcision later today   Isaiah Serge 10/03/2020, 9:39 AM

## 2020-10-03 NOTE — Lactation Note (Signed)
This note was copied from a baby's chart. Lactation Consultation Note  Patient Name: Sheryl Velasquez [id number] Date: 10/03/2020 Reason for consult: Initial assessment;Early term 37-38.6wks;Infant weight loss Age:30 years - 4 %  weight loss  As LC entered the room baby latched on the left breast modified laid back.  Per mom this baby is latching so much better than my 1st baby.  LC helped to position for increased depth, swallows noted, baby released . Still hungry and LC assisted to latch on the right breast / football with depth / swallows and per mom comfortable. Baby fed 8 mins with Latch Score of 9.  The [employer] came to transport the baby for a circ.  LC reviewed potential feeding behaviors with an early term infant.  LC plan :  Feed 8-12 times in 24 hours with feeding cues - due to baby being Early term by 3 hours STS.  Work on the depth with latch- wide open month with flanged lips as shown.  Since the baby is latching well - DEBP can wait to set up. If the baby becomes sluggish with feedings set up a DEBP or significant weight loss.   LC provided the [facility] brochure with resources.   Maternal Data Has patient been taught Hand Expression?: Yes Does the patient have breastfeeding experience prior to this delivery?: Yes How long did the patient breastfeed?: per mom 8 months  Feeding Mother's Current Feeding Choice: Breast Milk  LATCH Score Latch: Grasps breast easily, tongue down, lips flanged, rhythmical sucking.  Audible Swallowing: Spontaneous and intermittent  Type of Nipple: Everted at rest and after stimulation  Comfort (Breast/Nipple): Soft / non-tender  Hold (Positioning): Assistance needed to correctly position infant at breast and maintain latch.  LATCH Score: 9   Lactation Tools Discussed/Used    Interventions Interventions: Breast feeding basics reviewed;Assisted with latch;Skin to skin;Breast massage;Hand express;Breast compression;Adjust position;Support  pillows;Position options;Education  Discharge Pump: Personal;DEBP WIC Program: No  Consult Status Consult Status: Follow-up Date: 10/04/20 Follow-up type: In-patient    [place] 10/03/2020, 10:07 AM

## 2020-10-04 LAB — GLUCOSE, CAPILLARY: Glucose-Capillary: 87 mg/dL (ref 70–99)

## 2020-10-04 MED ORDER — ACETAMINOPHEN 500 MG PO TABS: 1000.0000 mg | ORAL_TABLET | Freq: Four times a day (QID) | ORAL | 0 refills | Status: DC

## 2020-10-04 MED ORDER — IBUPROFEN 600 MG PO TABS: 600.0000 mg | ORAL_TABLET | Freq: Four times a day (QID) | ORAL | 0 refills | Status: DC

## 2020-10-04 NOTE — Discharge Summary (Signed)
Postpartum Discharge Summary       Patient Name: Sheryl Velasquez DOB: [birth year] MRN: [id number]  Date of admission: 10/02/2020 Delivery date:10/02/2020  Delivering provider: Deliah Boston  Date of discharge: 10/04/2020  Admitting diagnosis: [redacted] weeks gestation of pregnancy [Z3A.37] Intrauterine pregnancy: [redacted]w[redacted]d    Secondary diagnosis:  Active Problems:   [redacted] weeks gestation of pregnancy  Additional problems: none    Discharge diagnosis: Term Pregnancy Delivered, CHTN, and GDM A2                                              Post partum procedures: none Augmentation: N/A Complications: None  Hospital course: Sceduled C/S   30y.o. yo [id number] 29w6das admitted to the hospital 10/02/2020 for scheduled cesarean section with the following indication:Malpresentation.Delivery details are as follows:  Membrane Rupture Time/Date: 8:25 AM ,10/02/2020   Delivery Method:C-Section, Low Transverse  Details of operation can be found in separate operative note.  Patient had an uncomplicated postpartum course.  Her blood pressure was normal on Procardia XL 3056mt discharge. Blood sugars were normal postpartum. She is ambulating, tolerating a regular diet, passing flatus, and urinating well. Patient is discharged home in stable condition on  10/04/20        Newborn Data: Birth date:10/02/2020  Birth time:8:26 AM  Gender:Female  Living status:Living  Apgars:8 ,10  Weight:3180 g     Magnesium Sulfate received: No BMZ received: No Rhophylac:No MMR:No T-DaP:Given prenatally Flu: No Transfusion:No  Physical exam  Vitals:   10/03/20 0609 10/03/20 1337 10/03/20 2106 10/04/20 0546  BP: 116/68 128/79 127/71 123/72  Pulse: 83 96 86 83  Resp: _0 Temp: 97.9 F (36.6 C) 98.3 F (36.8 C) 98.2 F (36.8 C) 98.2 F (36.8 C)  TempSrc: Oral Oral Oral Oral  SpO2: 99%  100% 100%  Weight:      Height:       General: alert and cooperative Lochia: appropriate Uterine Fundus:  firm Incision: Dressing is clean, dry, and intact DVT Evaluation: No evidence of DVT seen on physical exam. Labs: Lab Results  Component Value Date   WBC 10.0 10/03/2020   HGB 8.5 (L) 10/03/2020   HCT 25.1 (L) 10/03/2020   MCV 97.3 10/03/2020   PLT 130 (L) 10/03/2020   CMP Latest Ref Rng & Units 10/03/2020  Glucose 70 - 99 mg/dL -  BUN 6 - 20 mg/dL -  Creatinine 0.44 - 1.00 mg/dL 0.69  Sodium 135 - 145 mmol/L -  Potassium 3.5 - 5.1 mmol/L -  Chloride 98 - 111 mmol/L -  CO2 22 - 32 mmol/L -  Calcium 8.9 - 10.3 mg/dL -  Total Protein 6.5 - 8.1 g/dL -  Total Bilirubin 0.3 - 1.2 mg/dL -  Alkaline Phos 38 - 126 U/L -  AST 15 - 41 U/L -  ALT 0 - 44 U/L -   Edinburgh Score: Edinburgh Postnatal Depression Scale Screening Tool 10/02/2020  I have been able to laugh and see the funny side of things. 0  I have looked forward with enjoyment to things. 0  I have blamed myself unnecessarily when things went wrong. 1  I have been anxious or worried for no good reason. 0  I have felt scared or panicky for no good reason. 0  Things have been getting on top  of me. 1  I have been so unhappy that I have had difficulty sleeping. 0  I have felt sad or miserable. 0  I have been so unhappy that I have been crying. 0  The thought of harming myself has occurred to me. 0  Edinburgh Postnatal Depression Scale Total 2     After visit meds:  Allergies as of 10/04/2020   No Known Allergies      Medication List     STOP taking these medications    aspirin 81 MG chewable tablet   HYDROmorphone 2 MG tablet Commonly known as: DILAUDID   metFORMIN 500 MG 24 hr tablet Commonly known as: GLUCOPHAGE-XR   NIFEdipine 60 MG 24 hr tablet Commonly known as: ADALAT CC   traMADol 50 MG tablet Commonly known as: ULTRAM       TAKE these medications    acetaminophen 500 MG tablet Commonly known as: TYLENOL Take 2 tablets (1,000 mg total) by mouth every 6 (six) hours.   ibuprofen 600 MG  tablet Commonly known as: ADVIL Take 1 tablet (600 mg total) by mouth every 6 (six) hours.   loratadine 10 MG tablet Commonly known as: CLARITIN Take 10 mg by mouth daily.   multivitamin-prenatal 27-0.8 MG Tabs tablet Take 1 tablet by mouth at bedtime.   Probiotic Daily Caps Take 1 capsule by mouth daily.         Discharge home in stable condition Infant Feeding: Breast Infant Disposition:home with mother Discharge instruction: per After Visit Summary and Postpartum booklet. Activity: Advance as tolerated. Pelvic rest for 6 weeks.  Diet: routine diet Future Appointments:No future appointments. Follow up Visit:  Follow-up Information     Shivaji, Shanti M, MD Follow up in 1 week(s).   Specialty: Obstetrics and Gynecology Why: Blood pressure and incision check Contact information: [address] [place] [address] [phone]                  Please schedule this patient for a In person postpartum visit in 1 week with the following provider: MD. \Delivery mode:  C-Section, Low Transverse  Anticipated Birth Control:  Unsure   10/04/2020 Kathy W Richardson, MD    

## 2020-10-04 NOTE — Progress Notes (Signed)
Subjective: Postpartum Day 2: Cesarean Delivery Patient reports tolerating PO, + flatus, + BM, and no problems voiding.    Objective: Vital signs in last 24 hours: Temp:  [98.2 F (36.8 C)-98.3 F (36.8 C)] 98.2 F (36.8 C) (06/13 0546) Pulse Rate:  [83-96] 83 (06/13 0546) Resp:  [16-18] 18 (06/12 2106) BP: (123-128)/(71-79) 123/72 (06/13 0546) SpO2:  [100 %] 100 % (06/13 0546)  Physical Exam:  General: alert and cooperative Lochia: appropriate Uterine Fundus: firm Incision: dressing clean, dry and intact DVT Evaluation: No evidence of DVT seen on physical exam.  Recent Labs    10/03/20 0509  HGB 8.5*  HCT 25.1*    Assessment/Plan: Status post Cesarean section. Doing well postoperatively.  Discharge home with standard precautions and return to clinic i1 week for BP and incision check. BP stable on procardia 30mg  XL, to continue Logan Bores 10/04/2020, 9:42 AM

## 2020-10-04 NOTE — Lactation Note (Signed)
This note was copied from a baby's chart. Lactation Consultation Note  Patient Name: Sheryl Velasquez TOIZT'I Date: 10/04/2020   Age:29 hours  Mom reports that her breasts feel heavier this morning. Mom's nipples are intact except for one very minute mark/scab on the tip of each nipple.  I assisted with latch on the L breast. Infant did well, often with a suck:swallow ratio of 1:1. I showed Mom different hand positions to help infant access more of her colostrum/transitional milk. Mom could tell that her L breast was lighter/softer after infant finished feeding on that breast.   Mom was able to self-latch to the other side without assistance. Again, "[name]" had a suck:swallow ratio of 1:1 (verified by cervical auscultation). Mom was comfortable with each latch.   Mom has a [employer]ride at home. I do not think she needs to pump at this time, unless she begins to offer formula or she feels that there are issues with infant's ability at the breast. Parents know not to allow more than one 4-hr stretch per day between feedings.   Mom's milk came to volume in a timely manner with her 1st child (now 57 yo and born at 67w 0d). She had more than an adequate supply, able to give stored EBM for 2 additional months after infant stopped feeding at the breast.   Mom was very pleased with consult, saying that her confidence had increased. Parents know how to reach [place] for any post-discharge questions.    Matthias Hughs [facility] 10/04/2020, 9:05 AM

## 2020-10-05 LAB — CYTOLOGY - NON PAP

## 2020-10-05 LAB — SURGICAL PATHOLOGY

## 2022-01-17 ENCOUNTER — Encounter: Payer: Self-pay | Admitting: Bariatrics

## 2022-01-17 ENCOUNTER — Ambulatory Visit (INDEPENDENT_AMBULATORY_CARE_PROVIDER_SITE_OTHER): Payer: 59 | Admitting: Bariatrics

## 2022-01-17 VITALS — Ht 62.0 in | Wt 266.0 lb

## 2022-01-17 DIAGNOSIS — K760 Fatty (change of) liver, not elsewhere classified: Secondary | ICD-10-CM

## 2022-01-17 DIAGNOSIS — I10 Essential (primary) hypertension: Secondary | ICD-10-CM

## 2022-01-17 DIAGNOSIS — E282 Polycystic ovarian syndrome: Secondary | ICD-10-CM

## 2022-01-17 NOTE — Progress Notes (Signed)
  Office: [phone]  /  Fax: [phone]  Initial Visit  Sheryl Velasquez was seen in clinic today to evaluate for obesity. She is interested in losing weight to improve overall health and reduce the risk of weight related complications.   She presents today to review program treatment options, initial physical assessment, and evaluation.      Past medical history includes:   Past Medical History:  Diagnosis Date   Diabetes mellitus without complication ([place])    Fatty liver    Gestational diabetes    Hypertension    PCOS (polycystic ovarian syndrome)      Objective:   Ht '5\' 2"'$  (1.575 m)   Wt 266 lb (120.7 kg)   BMI 48.65 kg/m  She was weighed on the bioimpedance scale:  Body mass index is 48.65 kg/m.  General:  Alert, oriented and cooperative. Patient is in no acute distress.  Respiratory: Normal respiratory effort, no problems with respiration noted  Extremities: Normal range of motion.    Mental Status: Normal mood and affect. Normal behavior. Normal judgment and thought content.   Assessment and Plan:  1. PCOS (polycystic ovarian syndrome) - EKG 12-Lead  2. Essential hypertension - EKG 12-Lead  3. Fatty liver - EKG 12-Lead      Obesity Treatment Plan:  She will work on garnering support from family and friends to begin weight loss journey. Work on eliminating or reducing the presence of highly processed, calorie dense foods in the home. Complete provided nutritional and psychosocial assessment questionnaire.    Sheryl Velasquez will follow up in the next 1-2 weeks to review the above steps and continue evaluation and treatment.  Obesity Education Performed Today:  She was weighed on the bioimpedance scale and results were discussed and documented in the synopsis.  We discussed obesity as a disease and the importance of a more detailed evaluation of all the factors contributing to the disease.  We discussed the importance of long term lifestyle  changes which include nutrition, exercise and behavioral modifications as well as the importance of customizing this to her specific health and social needs.  We discussed the benefits of reaching a healthier weight to alleviate the symptoms of existing conditions and reduce the risks of the biomechanical, metabolic and psychological effects of obesity.  We discussed the goals of this program is to improve her overall health and not simply achieve a specific BMI.  Frequent visits are very important to patient success. I plan to see her every 2 weeks for the first 3 months and then evaluate the visit frequency after that time. I explained obesity is a life-long chronic disease and long term treatments would be required. Medications to help her follow his eating plan may be offered as appropriate but are not required. All medication decisions will be made together after the initial workup is done and benefits and side effects are discussed in depth.  The clinic rules were reviewed including the late policy, cancellation policy, no show and program fees.  Sheryl Velasquez appears to be in the action stage of change and states they are ready to start intensive lifestyle modifications and behavioral modifications.  30 minutes was spent today on this visit including the above counseling, pre-visit chart review, and post-visit documentation.  Sheryl Lesch, DO

## 2022-04-13 ENCOUNTER — Encounter (HOSPITAL_COMMUNITY): Payer: Self-pay | Admitting: *Deleted

## 2022-04-13 NOTE — Progress Notes (Addendum)
Spoke w/ via phone for pre-op interview--- [employer]----  UPT per anesthesia. Surgeon orders pending.             Lab results------EKG in Epic dated 12/2021 COVID test -----patient states asymptomatic no test needed Arrive at -------0530 NPO after MN NO Solid Food.   Med rec completed Medications to take morning of surgery -----NONE Diabetic medication -----NONE AM of surgery Patient instructed no nail polish to be worn day of surgery Patient instructed to bring photo id and insurance card day of surgery Patient aware to have Driver (ride ) / caregiver Sheryl Velasquez   for 24 hours after surgery  Patient Special Instructions ----- Pre-Op special Istructions ----- Patient verbalized understanding of instructions that were given at this phone interview. Patient denies shortness of breath, chest pain, fever, cough at this phone interview.

## 2022-04-19 NOTE — H&P (Signed)
Sheryl Velasquez is an 31 y.o. female. Presenting for scheduled surgery. Dull lower abdominal pain today but otherwise well.  Pertinent Gynecological History: Menses:  irregular Bleeding: dysfunctional uterine bleeding Contraception: OCP (estrogen/progesterone) DES exposure: denies Blood transfusions: none Sexually transmitted diseases: no past history Previous GYN Procedures:  none   Last mammogram: too young Last pap: 06/2021 - WNL OB History: G3, P2012   Menstrual History: Menarche age: 89 Patient's last menstrual period was 03/20/2022.    Past Medical History:  Diagnosis Date   Diabetes mellitus without complication ([place])    Fatty liver    Gestational diabetes    Hypertension    PCOS (polycystic ovarian syndrome)     Past Surgical History:  Procedure Laterality Date   CESAREAN SECTION N/A 10/02/2020   Procedure: CESAREAN SECTION;  Surgeon: Deliah Boston, MD;  Location: [facility] LD ORS;  Service: Obstetrics;  Laterality: N/A;   CHOLECYSTECTOMY  2015   WISDOM TOOTH EXTRACTION  2011    Family History  Problem Relation Age of Onset   Diabetes Mother    Hypertension Mother    Hypertension Father     Social History:  reports that she has never smoked. She has never used smokeless tobacco. She reports that she does not drink alcohol and does not use drugs.  Allergies: No Known Allergies  No medications prior to admission.    Review of Systems  Constitutional:  Negative for chills and fever.  Respiratory:  Negative for shortness of breath.   Cardiovascular:  Negative for chest pain, palpitations and leg swelling.  Gastrointestinal:  Negative for abdominal pain, nausea and vomiting.  Neurological:  Negative for dizziness, weakness and headaches.  Psychiatric/Behavioral:  Negative for suicidal ideas.     Height '5\' 2"'$  (1.575 m), weight 117.9 kg, last menstrual period 03/20/2022, unknown if currently breastfeeding. Physical Exam Gen: Obese, NAD CV; CTAB,  RRR Abd: mild TTP over RLQ Psych,Neuro - WNL No results found for this or any previous visit (from the past 24 hour(s)).  No results found.  Assessment/Plan: 30FA [id number] w/ h/o csx x1, BMI 51, presents for f/u of known right ovarian cyst -multiple cysts on right ovary, possible endometriomas vs adenomas vs hemorrhagic cysts - known PCOS -Given enlargement in 6wks, plan for operative removal - will call pt next week and discuss possibility of bilateral salpingectomy in addition to RSO to help determine surgical planning. Would ideally start with open laparoscopy, lap RSO and lap BS, possibly conversion to open given pt BMI and concern for adhesive burden. Decrease in ovarian reserve reviewed.  -Patient declines bilateral salpingectomy. Discussed R/B or surgical procedure including bleeding, infection, injury to surrounding organs. Patient agrees to blood products if needed. Patient understands and intends to comply.     Melida Quitter Ashonte Angelucci 04/19/2022, 4:18 PM

## 2022-04-19 NOTE — Anesthesia Preprocedure Evaluation (Signed)
Anesthesia Evaluation  Patient identified by MRN, date of birth, ID band Patient awake    Reviewed: Allergy & Precautions, H&P , NPO status , Patient's Chart, lab work & pertinent test results  Airway Mallampati: I  TM Distance: >3 FB Neck ROM: Full    Dental no notable dental hx. (+) Teeth Intact, Dental Advisory Given   Pulmonary neg pulmonary ROS   Pulmonary exam normal breath sounds clear to auscultation       Cardiovascular Exercise Tolerance: Good hypertension, Pt. on medications negative cardio ROS Normal cardiovascular exam Rhythm:Regular Rate:Normal     Neuro/Psych negative neurological ROS  negative psych ROS   GI/Hepatic negative GI ROS, Neg liver ROS,,,  Endo/Other  negative endocrine ROSdiabetes    Renal/GU negative Renal ROS  negative genitourinary   Musculoskeletal negative musculoskeletal ROS (+)    Abdominal   Peds negative pediatric ROS (+)  Hematology negative hematology ROS (+)   Anesthesia Other Findings PCOS  Reproductive/Obstetrics negative OB ROS                             Anesthesia Physical Anesthesia Plan  ASA: 3  Anesthesia Plan: General   Post-op Pain Management: Minimal or no pain anticipated, Tylenol PO (pre-op)* and Celebrex PO (pre-op)*   Induction: Intravenous  PONV Risk Score and Plan: 3 and Ondansetron, Dexamethasone, Midazolam and Scopolamine patch - Pre-op  Airway Management Planned: Oral ETT  Additional Equipment: None  Intra-op Plan:   Post-operative Plan: Extubation in OR  Informed Consent: I have reviewed the patients History and Physical, chart, labs and discussed the procedure including the risks, benefits and alternatives for the proposed anesthesia with the patient or authorized representative who has indicated his/her understanding and acceptance.       Plan Discussed with: Anesthesiologist and CRNA  Anesthesia Plan  Comments: (  )        Anesthesia Quick Evaluation

## 2022-04-20 ENCOUNTER — Encounter (HOSPITAL_BASED_OUTPATIENT_CLINIC_OR_DEPARTMENT_OTHER): Payer: Self-pay | Admitting: Obstetrics and Gynecology

## 2022-04-20 ENCOUNTER — Encounter (HOSPITAL_BASED_OUTPATIENT_CLINIC_OR_DEPARTMENT_OTHER)
Admission: RE | Disposition: A | Discharge status: Home / Self Care | Payer: Self-pay | Source: Home / Self Care | Attending: Obstetrics and Gynecology

## 2022-04-20 ENCOUNTER — Ambulatory Visit (HOSPITAL_BASED_OUTPATIENT_CLINIC_OR_DEPARTMENT_OTHER)
Admission: RE | Admit: 2022-04-20 | Discharge: 2022-04-20 | Disposition: A | Discharge status: Home / Self Care | Payer: 59 | Attending: Obstetrics and Gynecology | Admitting: Obstetrics and Gynecology

## 2022-04-20 ENCOUNTER — Other Ambulatory Visit: Payer: Self-pay

## 2022-04-20 ENCOUNTER — Ambulatory Visit (HOSPITAL_BASED_OUTPATIENT_CLINIC_OR_DEPARTMENT_OTHER): Payer: 59 | Admitting: Anesthesiology

## 2022-04-20 DIAGNOSIS — I10 Essential (primary) hypertension: Secondary | ICD-10-CM

## 2022-04-20 DIAGNOSIS — E119 Type 2 diabetes mellitus without complications: Secondary | ICD-10-CM

## 2022-04-20 DIAGNOSIS — E88819 Insulin resistance, unspecified: Secondary | ICD-10-CM

## 2022-04-20 DIAGNOSIS — D270 Benign neoplasm of right ovary: Secondary | ICD-10-CM | POA: Insufficient documentation

## 2022-04-20 DIAGNOSIS — Z01818 Encounter for other preprocedural examination: Secondary | ICD-10-CM

## 2022-04-20 DIAGNOSIS — N8301 Follicular cyst of right ovary: Secondary | ICD-10-CM | POA: Diagnosis not present

## 2022-04-20 DIAGNOSIS — Z79899 Other long term (current) drug therapy: Secondary | ICD-10-CM | POA: Insufficient documentation

## 2022-04-20 DIAGNOSIS — Z7984 Long term (current) use of oral hypoglycemic drugs: Secondary | ICD-10-CM

## 2022-04-20 DIAGNOSIS — N83201 Unspecified ovarian cyst, right side: Secondary | ICD-10-CM

## 2022-04-20 HISTORY — PX: LAPAROSCOPIC UNILATERAL SALPINGO OOPHERECTOMY: SHX5935

## 2022-04-20 LAB — CBC
HCT: 31.7 % — ABNORMAL LOW (ref 36.0–46.0)
Hemoglobin: 8.9 g/dL — ABNORMAL LOW (ref 12.0–15.0)
MCH: 20.5 pg — ABNORMAL LOW (ref 26.0–34.0)
MCHC: 28.1 g/dL — ABNORMAL LOW (ref 30.0–36.0)
MCV: 72.9 fL — ABNORMAL LOW (ref 80.0–100.0)
Platelets: 304 10*3/uL (ref 150–400)
RBC: 4.35 MIL/uL (ref 3.87–5.11)
RDW: 18.4 % — ABNORMAL HIGH (ref 11.5–15.5)
WBC: 6.8 10*3/uL (ref 4.0–10.5)
nRBC: 0 % (ref 0.0–0.2)

## 2022-04-20 LAB — GLUCOSE, CAPILLARY
Glucose-Capillary: 112 mg/dL — ABNORMAL HIGH (ref 70–99)
Glucose-Capillary: 130 mg/dL — ABNORMAL HIGH (ref 70–99)

## 2022-04-20 LAB — POCT PREGNANCY, URINE: Preg Test, Ur: NEGATIVE

## 2022-04-20 SURGERY — SALPINGO-OOPHORECTOMY, UNILATERAL, LAPAROSCOPIC
Anesthesia: General | Site: Abdomen | Laterality: Right

## 2022-04-20 MED ORDER — DIPHENHYDRAMINE HCL 50 MG/ML IJ SOLN
INTRAMUSCULAR | Status: AC
  Filled 2022-04-20: qty 1

## 2022-04-20 MED ORDER — FENTANYL CITRATE (PF) 100 MCG/2ML IJ SOLN
INTRAMUSCULAR | Status: AC
  Filled 2022-04-20: qty 2

## 2022-04-20 MED ORDER — ONDANSETRON HCL 4 MG/2ML IJ SOLN
INTRAMUSCULAR | Status: AC
  Filled 2022-04-20: qty 2

## 2022-04-20 MED ORDER — KETAMINE HCL 50 MG/5ML IJ SOSY
PREFILLED_SYRINGE | INTRAMUSCULAR | Status: AC
  Filled 2022-04-20: qty 5

## 2022-04-20 MED ORDER — ONDANSETRON HCL 4 MG/2ML IJ SOLN
INTRAMUSCULAR | Status: DC | PRN
  Administered 2022-04-20: 4 mg via INTRAVENOUS

## 2022-04-20 MED ORDER — OXYCODONE HCL 5 MG PO TABS
ORAL_TABLET | ORAL | Status: AC
  Filled 2022-04-20: qty 1

## 2022-04-20 MED ORDER — PROPOFOL 10 MG/ML IV BOLUS
INTRAVENOUS | Status: AC
  Filled 2022-04-20: qty 20

## 2022-04-20 MED ORDER — SCOPOLAMINE 1 MG/3DAYS TD PT72
MEDICATED_PATCH | TRANSDERMAL | Status: AC
  Filled 2022-04-20: qty 1

## 2022-04-20 MED ORDER — KETOROLAC TROMETHAMINE 30 MG/ML IJ SOLN
INTRAMUSCULAR | Status: AC
  Filled 2022-04-20: qty 1

## 2022-04-20 MED ORDER — MEPERIDINE HCL 25 MG/ML IJ SOLN: 6.2500 mg | INTRAMUSCULAR | Status: DC | PRN

## 2022-04-20 MED ORDER — DEXAMETHASONE SODIUM PHOSPHATE 10 MG/ML IJ SOLN
INTRAMUSCULAR | Status: AC
  Filled 2022-04-20: qty 1

## 2022-04-20 MED ORDER — MIDAZOLAM HCL 2 MG/2ML IJ SOLN
INTRAMUSCULAR | Status: DC | PRN
  Administered 2022-04-20: 2 mg via INTRAVENOUS

## 2022-04-20 MED ORDER — LACTATED RINGERS IV SOLN: INTRAVENOUS | Status: DC

## 2022-04-20 MED ORDER — MIDAZOLAM HCL 2 MG/2ML IJ SOLN
INTRAMUSCULAR | Status: AC
  Filled 2022-04-20: qty 2

## 2022-04-20 MED ORDER — DIPHENHYDRAMINE HCL 50 MG/ML IJ SOLN
INTRAMUSCULAR | Status: DC | PRN
  Administered 2022-04-20: 12.5 mg via INTRAVENOUS

## 2022-04-20 MED ORDER — LIDOCAINE HCL (PF) 2 % IJ SOLN
INTRAMUSCULAR | Status: AC
  Filled 2022-04-20: qty 5

## 2022-04-20 MED ORDER — FENTANYL CITRATE (PF) 250 MCG/5ML IJ SOLN
INTRAMUSCULAR | Status: AC
  Filled 2022-04-20: qty 5

## 2022-04-20 MED ORDER — PROPOFOL 10 MG/ML IV BOLUS
INTRAVENOUS | Status: DC | PRN
  Administered 2022-04-20: 20 mg via INTRAVENOUS
  Administered 2022-04-20: 200 mg via INTRAVENOUS

## 2022-04-20 MED ORDER — SODIUM CHLORIDE 0.9 % IR SOLN
Status: DC | PRN
  Administered 2022-04-20: 1000 mL

## 2022-04-20 MED ORDER — OXYCODONE HCL 5 MG PO TABS: 5.0000 mg | ORAL_TABLET | Freq: Four times a day (QID) | ORAL | 0 refills | Status: DC | PRN

## 2022-04-20 MED ORDER — FENTANYL CITRATE (PF) 100 MCG/2ML IJ SOLN
INTRAMUSCULAR | Status: DC | PRN
  Administered 2022-04-20 (×2): 100 ug via INTRAVENOUS
  Administered 2022-04-20: 50 ug via INTRAVENOUS

## 2022-04-20 MED ORDER — IBUPROFEN 800 MG PO TABS: 800.0000 mg | ORAL_TABLET | Freq: Three times a day (TID) | ORAL | 0 refills | Status: DC | PRN

## 2022-04-20 MED ORDER — KETOROLAC TROMETHAMINE 15 MG/ML IJ SOLN
15.0000 mg | INTRAMUSCULAR | Status: AC
  Administered 2022-04-20: 30 mg via INTRAVENOUS

## 2022-04-20 MED ORDER — ACETAMINOPHEN 160 MG/5ML PO SOLN: 325.0000 mg | ORAL | Status: DC | PRN

## 2022-04-20 MED ORDER — FENTANYL CITRATE (PF) 100 MCG/2ML IJ SOLN
25.0000 ug | INTRAMUSCULAR | Status: DC | PRN
  Administered 2022-04-20: 50 ug via INTRAVENOUS

## 2022-04-20 MED ORDER — LACTATED RINGERS IV SOLN: INTRAVENOUS | Status: DC | PRN

## 2022-04-20 MED ORDER — KETAMINE HCL 10 MG/ML IJ SOLN
INTRAMUSCULAR | Status: DC | PRN
  Administered 2022-04-20: 30 mg via INTRAVENOUS
  Administered 2022-04-20: 10 mg via INTRAVENOUS

## 2022-04-20 MED ORDER — LIDOCAINE 2% (20 MG/ML) 5 ML SYRINGE
INTRAMUSCULAR | Status: DC | PRN
  Administered 2022-04-20: 60 mg via INTRAVENOUS

## 2022-04-20 MED ORDER — OXYCODONE HCL 5 MG/5ML PO SOLN: 5.0000 mg | Freq: Once | ORAL | Status: AC | PRN

## 2022-04-20 MED ORDER — BUPIVACAINE HCL (PF) 0.25 % IJ SOLN
INTRAMUSCULAR | Status: DC | PRN
  Administered 2022-04-20: 14 mL

## 2022-04-20 MED ORDER — ROCURONIUM BROMIDE 10 MG/ML (PF) SYRINGE
PREFILLED_SYRINGE | INTRAVENOUS | Status: DC | PRN
  Administered 2022-04-20: 20 mg via INTRAVENOUS
  Administered 2022-04-20: 40 mg via INTRAVENOUS
  Administered 2022-04-20: 60 mg via INTRAVENOUS

## 2022-04-20 MED ORDER — OXYCODONE HCL 5 MG PO TABS
5.0000 mg | ORAL_TABLET | Freq: Once | ORAL | Status: AC | PRN
  Administered 2022-04-20: 5 mg via ORAL

## 2022-04-20 MED ORDER — SCOPOLAMINE 1 MG/3DAYS TD PT72
1.0000 | MEDICATED_PATCH | TRANSDERMAL | Status: DC
  Administered 2022-04-20: 1.5 mg via TRANSDERMAL

## 2022-04-20 MED ORDER — SUGAMMADEX SODIUM 200 MG/2ML IV SOLN
INTRAVENOUS | Status: DC | PRN
  Administered 2022-04-20: 200 mg via INTRAVENOUS

## 2022-04-20 MED ORDER — DEXAMETHASONE SODIUM PHOSPHATE 10 MG/ML IJ SOLN
INTRAMUSCULAR | Status: DC | PRN
  Administered 2022-04-20: 5 mg via INTRAVENOUS

## 2022-04-20 MED ORDER — ONDANSETRON HCL 4 MG/2ML IJ SOLN: 4.0000 mg | Freq: Once | INTRAMUSCULAR | Status: DC | PRN

## 2022-04-20 MED ORDER — ACETAMINOPHEN 325 MG PO TABS: 325.0000 mg | ORAL_TABLET | ORAL | Status: DC | PRN

## 2022-04-20 MED ORDER — POVIDONE-IODINE 10 % EX SWAB: 2.0000 | Freq: Once | CUTANEOUS | Status: DC

## 2022-04-20 SURGICAL SUPPLY — 38 items
ADH SKN CLS APL DERMABOND .7 (GAUZE/BANDAGES/DRESSINGS) ×2
COVER SURGICAL LIGHT HANDLE (MISCELLANEOUS) ×1 IMPLANT
DERMABOND ADVANCED .7 DNX12 (GAUZE/BANDAGES/DRESSINGS) ×2 IMPLANT
DRAPE STERI URO 9X17 APER PCH (DRAPES) ×2 IMPLANT
DRSG OPSITE POSTOP 3X4 (GAUZE/BANDAGES/DRESSINGS) IMPLANT
DURAPREP 26ML APPLICATOR (WOUND CARE) ×2 IMPLANT
GAUZE 4X4 16PLY ~~LOC~~+RFID DBL (SPONGE) ×1 IMPLANT
GLOVE BIOGEL PI IND STRL 6.5 (GLOVE) ×4 IMPLANT
GLOVE ECLIPSE 6.5 STRL STRAW (GLOVE) ×2 IMPLANT
GOWN STRL REUS W/TWL LRG LVL3 (GOWN DISPOSABLE) ×4 IMPLANT
KIT PINK PAD W/HEAD ARE REST (MISCELLANEOUS) ×2
KIT PINK PAD W/HEAD ARM REST (MISCELLANEOUS) ×1 IMPLANT
KIT TURNOVER CYSTO (KITS) ×2 IMPLANT
NDL INSUFFLATION 14GA 120MM (NEEDLE) ×1 IMPLANT
NEEDLE INSUFFLATION 14GA 120MM (NEEDLE) ×2 IMPLANT
NS IRRIG 1000ML POUR BTL (IV SOLUTION) ×2 IMPLANT
PACK LAPAROSCOPY BASIN (CUSTOM PROCEDURE TRAY) ×2 IMPLANT
PROTECTOR NERVE ULNAR (MISCELLANEOUS) ×4 IMPLANT
SCISSORS LAP 5X35 DISP (ENDOMECHANICALS) IMPLANT
SET SUCTION IRRIG HYDROSURG (IRRIGATION / IRRIGATOR) ×1 IMPLANT
SET TUBE SMOKE EVAC HIGH FLOW (TUBING) ×2 IMPLANT
SHEARS HARMONIC ACE PLUS 36CM (ENDOMECHANICALS) ×1 IMPLANT
SUT VIC AB 0 CT1 27 (SUTURE) ×2
SUT VIC AB 0 CT1 27XBRD ANBCTR (SUTURE) ×1 IMPLANT
SUT VIC AB 3-0 PS2 18 (SUTURE) ×2
SUT VIC AB 3-0 PS2 18XBRD (SUTURE) ×2 IMPLANT
SUT VICRYL 0 UR6 27IN ABS (SUTURE) ×2 IMPLANT
SYS BAG RETRIEVAL 10MM (BASKET) ×2
SYS RETRIEVAL 5MM INZII UNIV (BASKET)
SYSTEM BAG RETRIEVAL 10MM (BASKET) ×1 IMPLANT
SYSTEM RETRIEVL 5MM INZII UNIV (BASKET) IMPLANT
TOWEL OR 17X26 10 PK STRL BLUE (TOWEL DISPOSABLE) ×2 IMPLANT
TRAY FOLEY W/BAG SLVR 14FR LF (SET/KITS/TRAYS/PACK) ×2 IMPLANT
TROCAR 5MMX150MM (TROCAR) ×3 IMPLANT
TROCAR Z-THREAD FIOS 11X100 BL (TROCAR) IMPLANT
TROCAR Z-THREAD FIOS 12X100MM (TROCAR) ×1 IMPLANT
TROCAR Z-THREAD FIOS 5X100MM (TROCAR) ×2 IMPLANT
WARMER LAPAROSCOPE (MISCELLANEOUS) ×2 IMPLANT

## 2022-04-20 NOTE — Transfer of Care (Signed)
Immediate Anesthesia Transfer of Care Note  Patient: Sheryl Velasquez  Procedure(s) Performed: Procedure(s) (LRB): LAPAROSCOPIC RIGHT SALPINGO-OOPHORECTOMY (Right)  Patient Location: PACU  Anesthesia Type: General  Level of Consciousness: awake, alert  and oriented  Airway & Oxygen Therapy: Patient Spontanous Breathing and Patient connected to face mask oxygen  Post-op Assessment: Report given to PACU RN and Post -op Vital signs reviewed and stable  Post vital signs: Reviewed and stable  Complications: No apparent anesthesia complications  Last Vitals:  Vitals Value Taken Time  BP 134/85 04/20/22 1000  Temp    Pulse 104 04/20/22 1002  Resp 25 04/20/22 1002  SpO2 100 % 04/20/22 1002  Vitals shown include unvalidated device data.  Last Pain:  Vitals:   04/20/22 0540  TempSrc: Oral         Complications: No notable events documented.

## 2022-04-20 NOTE — Brief Op Note (Signed)
04/20/2022  9:36 AM  PATIENT:  Townsend Roger  31 y.o. female  PRE-OPERATIVE DIAGNOSIS:  CYST RIGHT OVARY  POST-OPERATIVE DIAGNOSIS:  CYST RIGHT OVARY  PROCEDURE:  Procedure(s): LAPAROSCOPIC RIGHT SALPINGO-OOPHORECTOMY (Right)  SURGEON:  Surgeon(s) and Role:    * Lanier Felty, Melida Quitter, MD - Primary    * Meisinger, Todd, MD - Assisting   ANESTHESIA:   general  EBL:  40 mL   BLOOD ADMINISTERED:none  DRAINS: none   LOCAL MEDICATIONS USED:  MARCAINE     SPECIMEN:  Source of Specimen:  right ovarian cyst, ovary and fallopian tube  DISPOSITION OF SPECIMEN:  PATHOLOGY  COUNTS:  YES  TOURNIQUET:  * No tourniquets in log *  DICTATION: .Note written in EPIC  PLAN OF CARE: Discharge to home after PACU  PATIENT DISPOSITION:  PACU - hemodynamically stable.   Delay start of Pharmacological VTE agent (>24hrs) due to surgical blood loss or risk of bleeding: yes

## 2022-04-20 NOTE — Discharge Instructions (Signed)
No ibuprofen, Advil, Aleve, Motrin, ketorolac, meloxicam, naproxen, or other NSAIDS until after 3:40 pm today if needed.     Post Anesthesia Home Care Instructions  Activity: Get plenty of rest for the remainder of the day. A responsible individual must stay with you for 24 hours following the procedure.  For the next 24 hours, DO NOT: -Drive a car -Paediatric nurse -Drink alcoholic beverages -Take any medication unless instructed by your physician -Make any legal decisions or sign important papers.  Meals: Start with liquid foods such as gelatin or soup. Progress to regular foods as tolerated. Avoid greasy, spicy, heavy foods. If nausea and/or vomiting occur, drink only clear liquids until the nausea and/or vomiting subsides. Call your physician if vomiting continues.  Special Instructions/Symptoms: Your throat may feel dry or sore from the anesthesia or the breathing tube placed in your throat during surgery. If this causes discomfort, gargle with warm salt water. The discomfort should disappear within 24 hours.  If you had a scopolamine patch placed behind your ear for the management of post- operative nausea and/or vomiting:  1. The medication in the patch is effective for 72 hours, after which it should be removed.  Wrap patch in a tissue and discard in the trash. Wash hands thoroughly with soap and water. 2. You may remove the patch earlier than 72 hours if you experience unpleasant side effects which may include dry mouth, dizziness or visual disturbances. 3. Avoid touching the patch. Wash your hands with soap and water after contact with the patch.

## 2022-04-20 NOTE — Op Note (Signed)
04/20/22 Surgeon: Eula Flax, MD Assistant: Cheri Fowler, MD Preoperative Diagnosis: Symptomatic right ovarian cyst Postoperative Diagnosis: Same as above Procedure: Laparoscopic right salpingo-oophorectomy Anesthesia: GETA by Dr Ambrose Pancoast IVF: 1100cc LR EBL: <50cc UOP: 200cc clear urine Complications: Additionaly time for procedure 2/2 insertion of 2nd IV prior to incision as well as lengthy piecemeal removal of specimen from [facility] bag Specimen: right ovary, cystic components and fallopian tubes Findings: Normal appearning left ovary, fallopian tube, uterine contour. No obvious adhesions from prior PLTCS. [place], irregular contour on right ovary, right tube WNL.    Indications. Sheryl Velasquez is a 31yo G3 P 2012 with symptomatic right ovarian cyst that was drained previous but has now grown. PO medication does not help pelvic pain. TVUS showed Uterus 8.1x5.2x4.4cm, EMS 8.368m, Lf ov 3x2.6x3.1cm, rt ovary ENLARGED from last scan in Sept 5.4x6x6cm with vol 101.2cm^3 (endometrioma, mucinous appearing cystic lesions avascular, multiple debris-filled lesions)   Operative Procedure: The patient was taken to the operating room where a time out was performed to confirm patient and correct procedure. The patient was given not in accordance with ACOG guidelines based on weight. General anesthesia was established. The patient was then positioned on the operating table in the dorsal lithotomy position with the legs supported using stirrups. All pressure points were padded and a Bair hugger was placed to maintain control of core body temperature. A second IV was placed given laparoscopic positioning planned and BMI. Additional time here prior to incision.   The patient was then prepped and draped in the usual sterile fashion. A Foley catheter was inserted in normal sterile fashion. An operative speculum was placed into the vagina. A hulka manipulator was placed without issue   Attention was then turned the  abdomen with 0.25% marcaine plain was injected subdermally infraumbilically. A 559mvertical incision was made infraumbilically. A 68m59mrochar and sleeve were inserted through the incision using direct visualization with laparoscope. Once intraabdominal entry was confirmed. Pneumoperitoneum was established using CO2. Subsequently, two other incisions were made in left and right lower quadrants, both 2cm above and 2cm medial to corresponding ASIS's. LLQ site 37m37mLQ site 68mm.90mAfter laparoscopic survey revealed above, harmonic scalpel used to transect the uteroovarian ligament on the right side along with the right fallopian tube at the uterine fundus insertion. Right infundibulopelvic ligament along with ovarian vasculature. Excellent hemostasis noted.  37mm 17mcatch inserted in LLQ oport. Specimen scooped into bag without issue. The specimen was slowly removed within the bag through thr port site after trochar removal. Majority of time spent here; mucinous, serous and what appeared to be old clot all removed in the process. After bag was able to be removed in total, deep stitch x1 of 0-vicryl on UR-6 placed. The RSO excision site was once again examined, appeared hemostatic.  The remaining trochars were removed without issue and pneumoperitoneum was evacuated. Skin was closed using 4-0 Monocryl and Dermabond overlying. Hulka manipulator removed without issue   The patient was transferred to recovery room in stable condition. All needle, sponge ad instrument counts were noted to be correct. x2 at end of procedure.

## 2022-04-20 NOTE — Anesthesia Procedure Notes (Signed)
Procedure Name: Intubation Date/Time: 04/20/2022 7:49 AM  Performed by: Mechele Claude, CRNAPre-anesthesia Checklist: Patient identified, Emergency Drugs available, Suction available and Patient being monitored Patient Re-evaluated:Patient Re-evaluated prior to induction Oxygen Delivery Method: Circle system utilized Preoxygenation: Pre-oxygenation with 100% oxygen Induction Type: IV induction Ventilation: Mask ventilation without difficulty Laryngoscope Size: Mac and 3 Grade View: Grade I Tube type: Oral Tube size: 7.0 mm Number of attempts: 1 Airway Equipment and Method: Stylet and Oral airway Placement Confirmation: ETT inserted through vocal cords under direct vision, positive ETCO2 and breath sounds checked- equal and bilateral Secured at: 21 cm Tube secured with: Tape Dental Injury: Teeth and Oropharynx as per pre-operative assessment

## 2022-04-21 ENCOUNTER — Encounter (HOSPITAL_BASED_OUTPATIENT_CLINIC_OR_DEPARTMENT_OTHER): Payer: Self-pay | Admitting: Obstetrics and Gynecology

## 2022-04-21 LAB — SURGICAL PATHOLOGY

## 2022-04-21 NOTE — Anesthesia Postprocedure Evaluation (Signed)
Anesthesia Post Note  Patient: Sheryl Velasquez  Procedure(s) Performed: LAPAROSCOPIC RIGHT SALPINGO-OOPHORECTOMY (Right: Abdomen)     Patient location during evaluation: PACU Anesthesia Type: General Level of consciousness: awake and alert Pain management: pain level controlled Vital Signs Assessment: post-procedure vital signs reviewed and stable Respiratory status: spontaneous breathing, nonlabored ventilation, respiratory function stable and patient connected to nasal cannula oxygen Cardiovascular status: blood pressure returned to baseline and stable Postop Assessment: no apparent nausea or vomiting Anesthetic complications: no  No notable events documented.  Last Vitals:  Vitals:   04/20/22 1030 04/20/22 1120  BP: 116/74 118/76  Pulse: 96 96  Resp: (!) 23 (!) 21  Temp:    SpO2: 95% 100%    Last Pain:  Vitals:   04/21/22 1141  TempSrc:   PainSc: 2                  Dorethy Tomey

## 2022-05-01 ENCOUNTER — Non-Acute Institutional Stay (HOSPITAL_COMMUNITY)
Admission: RE | Admit: 2022-05-01 | Discharge: 2022-05-01 | Disposition: A | Discharge status: Home / Self Care | Payer: 59 | Source: Ambulatory Visit | Attending: Internal Medicine | Admitting: Internal Medicine

## 2022-05-01 DIAGNOSIS — D649 Anemia, unspecified: Secondary | ICD-10-CM | POA: Diagnosis present

## 2022-05-01 MED ORDER — SODIUM CHLORIDE 0.9 % IV SOLN: INTRAVENOUS | Status: DC | PRN

## 2022-05-01 MED ORDER — SODIUM CHLORIDE 0.9 % IV SOLN
510.0000 mg | Freq: Once | INTRAVENOUS | Status: AC
  Administered 2022-05-01: 510 mg via INTRAVENOUS
  Filled 2022-05-01: qty 510

## 2022-05-01 NOTE — Progress Notes (Signed)
PATIENT CARE CENTER NOTE  Diagnosis: Anemia D64.9   Provider: Eula Flax MD  Procedure: Feraheme 510 mg  Note: Patient received Ferhame 510 mg infusion (dose #1 of 2) via PIV. Pre infusion BP was 136/90 and recheck was 144/89. Pt stated feeling fine and having no symptoms. Pt also stated that she takes BP medication at night. RN reached out to [employer] office to see if it was ok to proceed with Feraheme infusion. RN spoke with Janett Billow who verified with ordering provider and stated that we can proceed with Feraheme infusion today.   Discharge instructions given. Patient observed for 30 minutes after infusion. Patient advised to schedule next appointment at front desk. Alert, oriented and ambulatory at discharge.

## 2022-05-08 ENCOUNTER — Non-Acute Institutional Stay (HOSPITAL_COMMUNITY)
Admission: RE | Admit: 2022-05-08 | Discharge: 2022-05-08 | Disposition: A | Discharge status: Home / Self Care | Payer: 59 | Source: Ambulatory Visit | Attending: Internal Medicine | Admitting: Internal Medicine

## 2022-05-08 DIAGNOSIS — D649 Anemia, unspecified: Secondary | ICD-10-CM | POA: Diagnosis not present

## 2022-05-08 MED ORDER — SODIUM CHLORIDE 0.9 % IV SOLN
510.0000 mg | Freq: Once | INTRAVENOUS | Status: AC
  Administered 2022-05-08: 510 mg via INTRAVENOUS
  Filled 2022-05-08: qty 510

## 2022-05-08 MED ORDER — SODIUM CHLORIDE 0.9 % IV SOLN: INTRAVENOUS | Status: DC | PRN

## 2022-05-08 NOTE — Progress Notes (Signed)
PATIENT CARE CENTER NOTE  Diagnosis: Anemia D64.9    Provider: Eula Flax MD  Procedure: Feraheme 510 mg  Note: Patient received Feraheme infusion (dose #2 of 2) via PIV. Tolerated infusion well with no adverse reaction. Pt observed for 30 minutes post infusion. AVS offered, but pt declined. Alert, oriented and ambulatory at discharge.

## 2022-08-15 LAB — HEPATITIS C ANTIBODY: HCV Ab: NEGATIVE

## 2022-08-15 LAB — OB RESULTS CONSOLE HEPATITIS B SURFACE ANTIGEN: Hepatitis B Surface Ag: NEGATIVE

## 2022-08-15 LAB — OB RESULTS CONSOLE GC/CHLAMYDIA
Chlamydia: NEGATIVE
Neisseria Gonorrhea: NEGATIVE

## 2022-08-15 LAB — OB RESULTS CONSOLE RPR: RPR: NONREACTIVE

## 2022-08-15 LAB — OB RESULTS CONSOLE RUBELLA ANTIBODY, IGM: Rubella: IMMUNE

## 2022-08-15 LAB — OB RESULTS CONSOLE HIV ANTIBODY (ROUTINE TESTING): HIV: NONREACTIVE

## 2022-08-15 LAB — OB RESULTS CONSOLE ANTIBODY SCREEN: Antibody Screen: NEGATIVE

## 2022-09-20 IMAGING — US US RENAL
1 series · 15 of 25 positions shown · non-contrast
Comparison: August 18, 2020

CLINICAL DATA: Right flank pain, 35 weeks pregnant.

EXAM:
RENAL / URINARY TRACT ULTRASOUND COMPLETE

[Series 1: us renal · 15 of 42 slices shown]
[im 1/42]
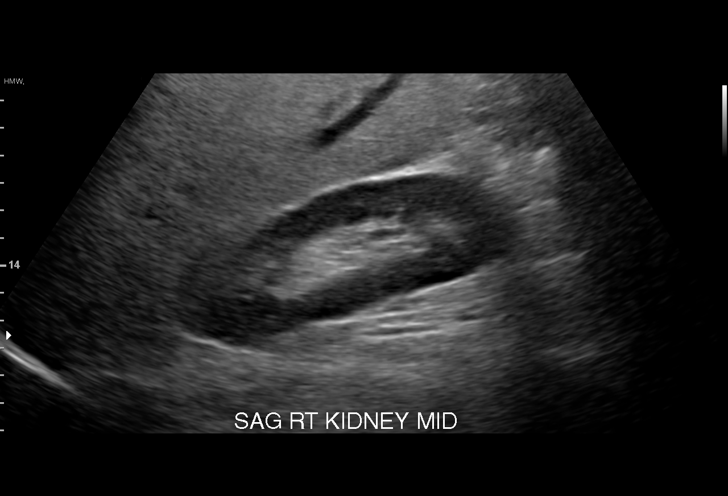
[im 4/42]
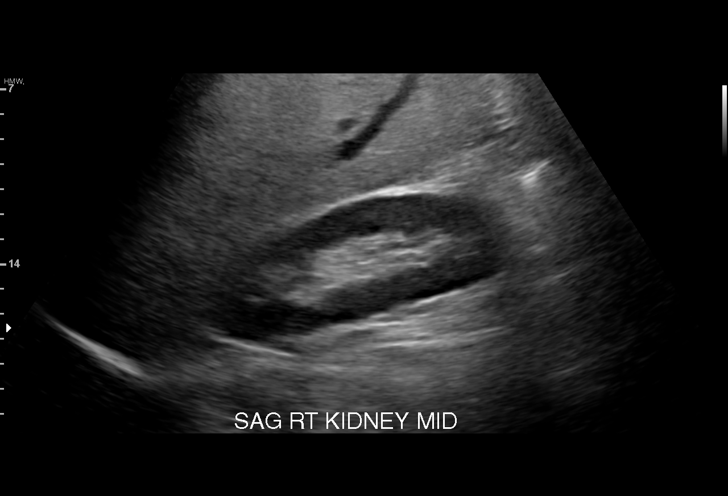
[im 7/42]
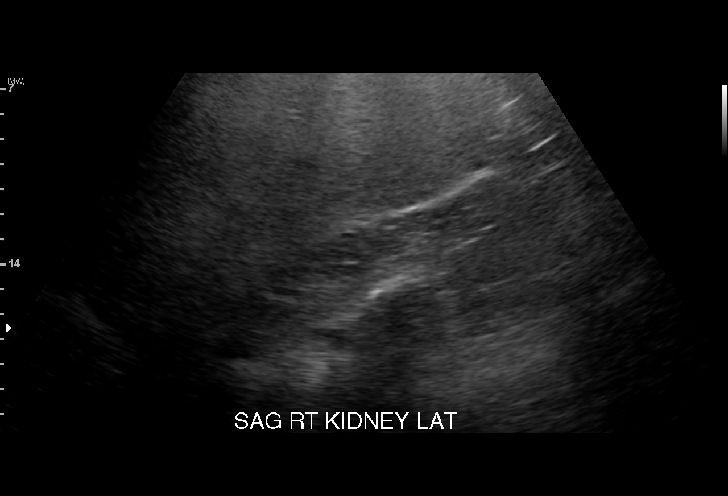
[im 9/42]
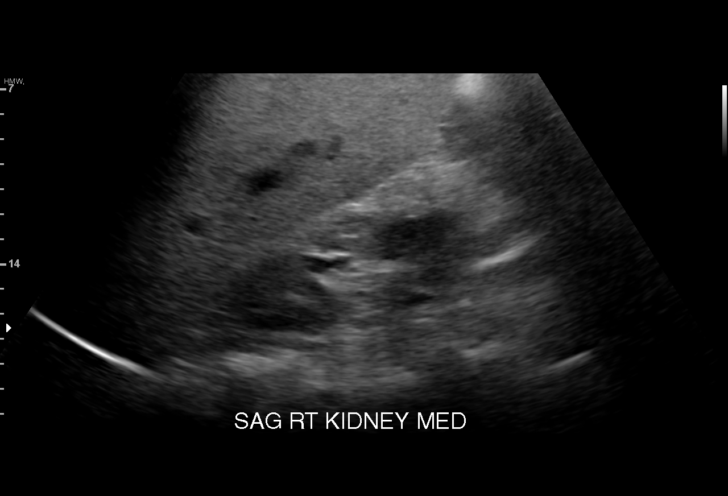
[im 12/42]
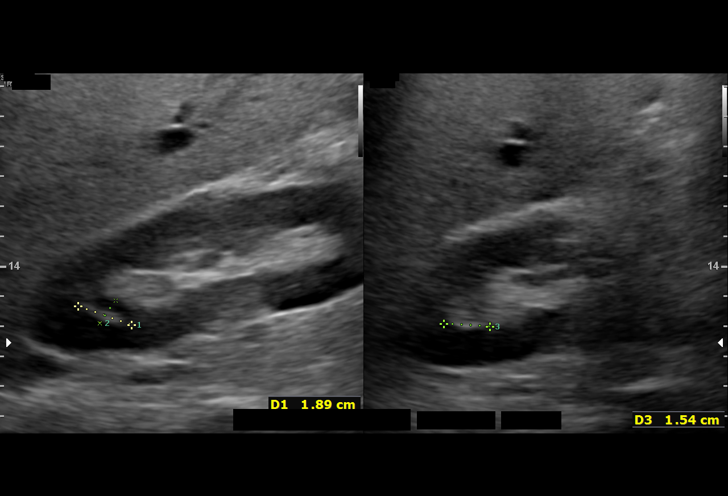
[im 16/42]
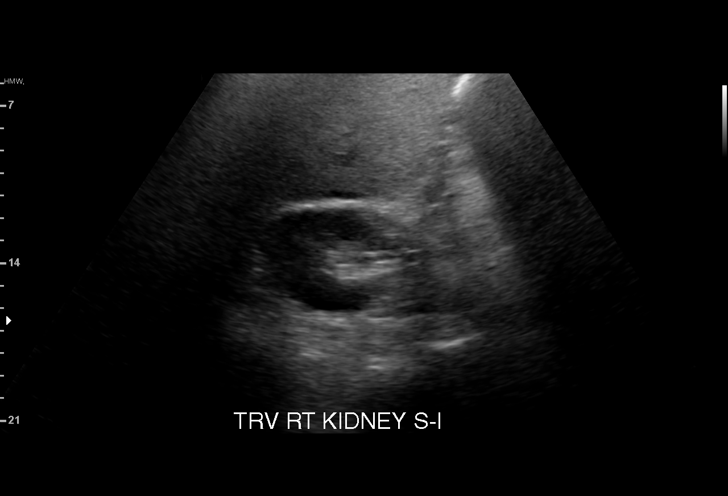
[im 18/42]
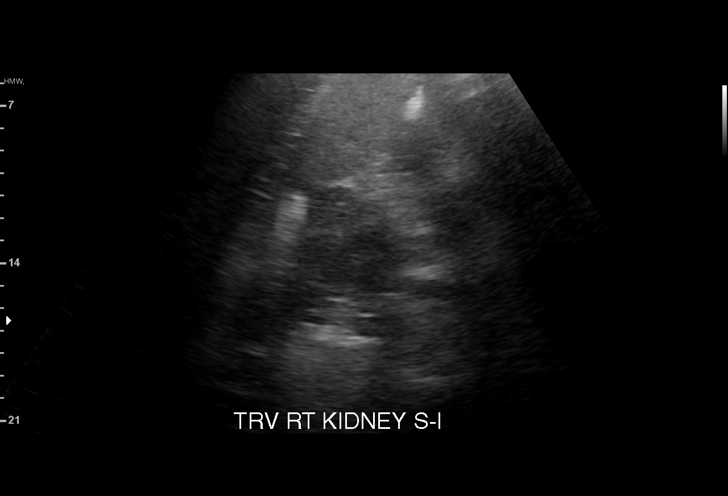
[im 21/42]
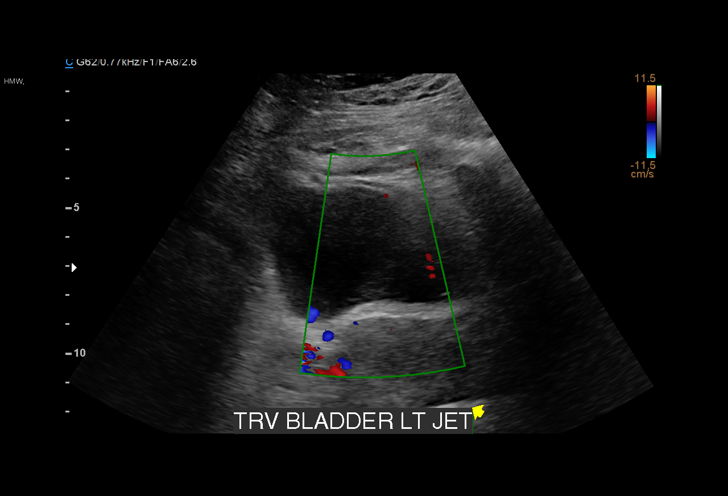
[im 24/42]
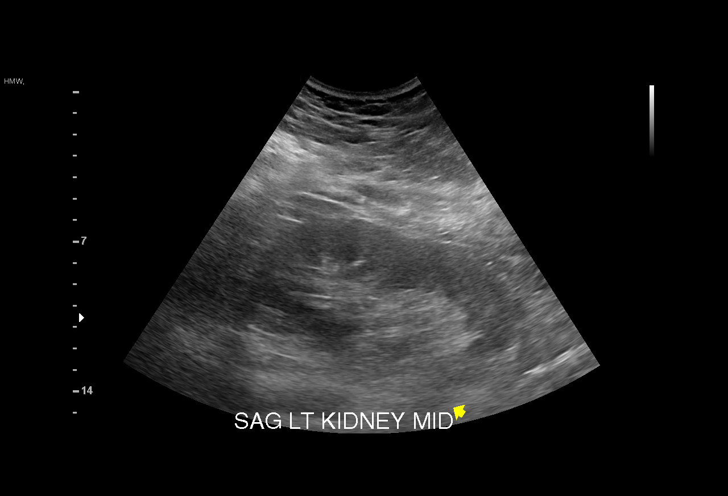
[im 26/42]
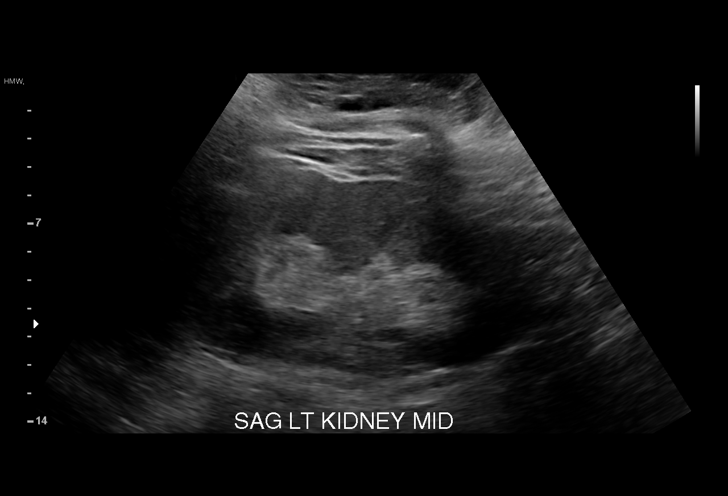
[im 30/42]
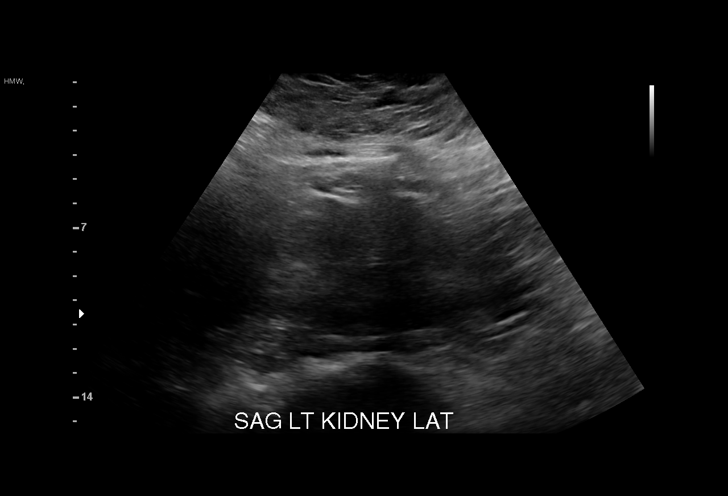
[im 33/42]
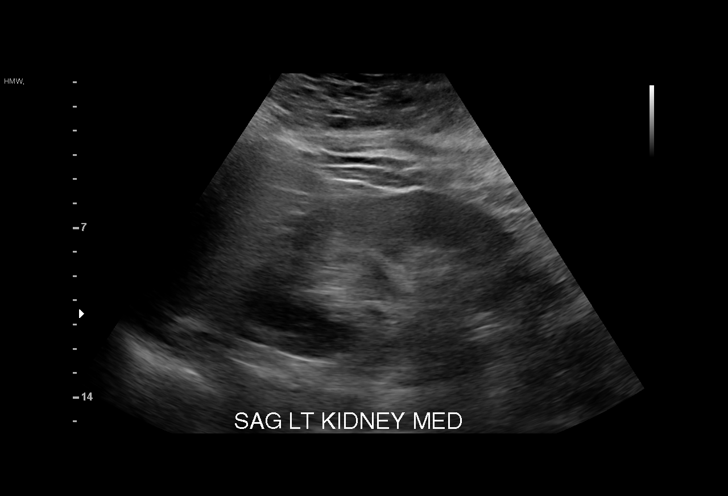
[im 35/42]
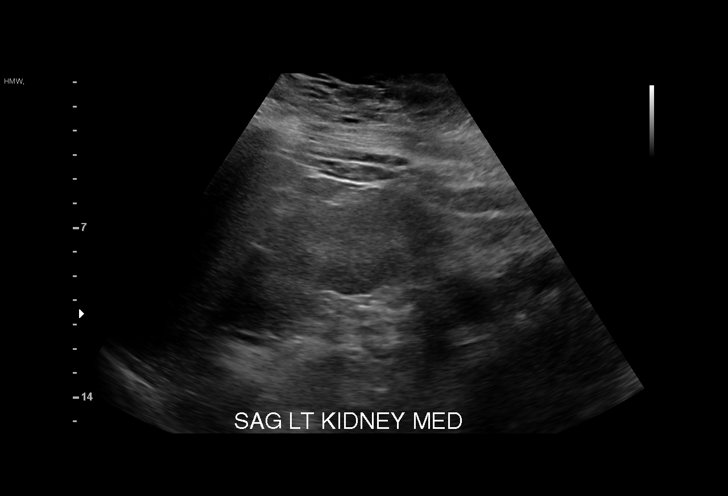
[im 38/42]
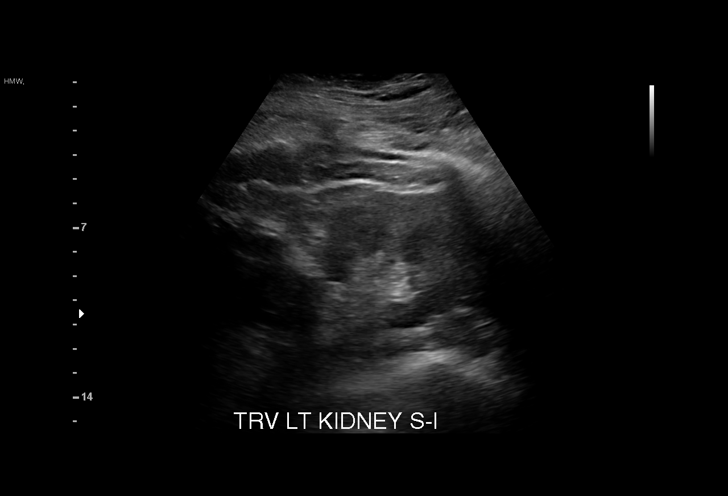
[im 42/42]
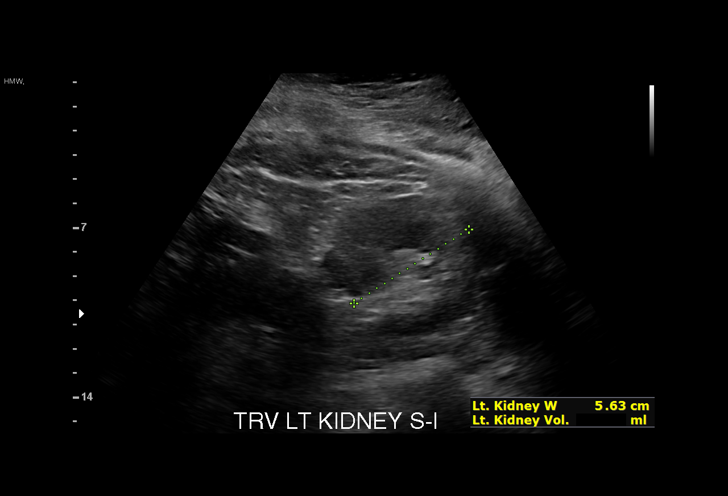

[15 of 25 positions shown; findings below may reference images not displayed]

FINDINGS: Right Kidney:

Renal measurements: 11.6 x 5.5 x 4.3 cm = volume: 144 mL.
Echogenicity within normal limits. No hydronephrosis visualized. No
significant change in the nonshadowing echogenic focus in the upper
pole of the right kidney measuring 1.3 cm, likely representing an
angiomyolipoma.

Left Kidney:

Renal measurements: 12.5 x 6.1 x 5.6 cm = volume: 225 mL.
Echogenicity within normal limits. No mass or hydronephrosis
visualized.

Bladder:

Appears normal for degree of bladder distention.

Other:

Bilateral ureteral jets are visualized.
IMPRESSION: No hydronephrosis.

Stable nonshadowing echogenic focus in the upper pole the right
kidney, favored to represent an angiomyolipoma.

## 2022-09-21 IMAGING — US US ART/VEN ABD/PELV/SCROTUM DOPPLER COMPLETE
1 series · 15 of 25 positions shown · non-contrast
Comparison: CT 09/14/2020, ultrasound 02/12/2018

CLINICAL DATA: Right ovarian cyst

EXAM:
LIMITED ULTRASOUND OF PELVIS
DOPPLER ULTRASOUND OF OVARIES
TECHNIQUE: Limited transabdominal ultrasound examination of the pelvis was
performed to evaluate the ovaries and adnexa regions only.
Color and duplex Doppler ultrasound was utilized to evaluate blood
flow to the ovaries.

[Series 1: us art/ven abd/pelv/scrotum doppler complete · 30 acquisitions, 15 frames shown]
[im 1/30]
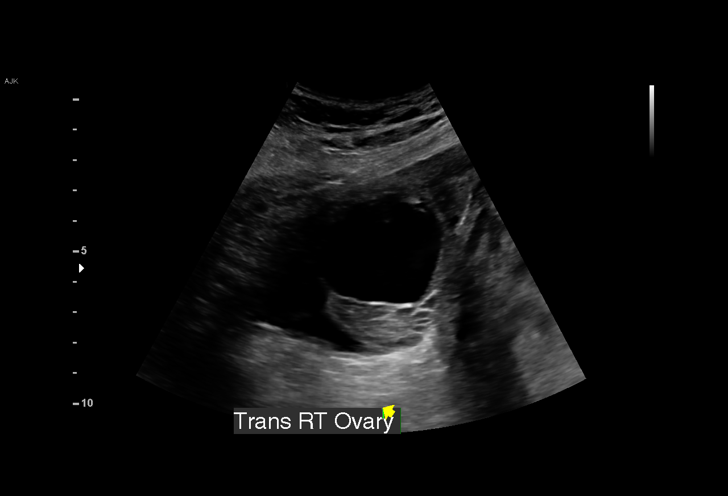
[im 3/30]
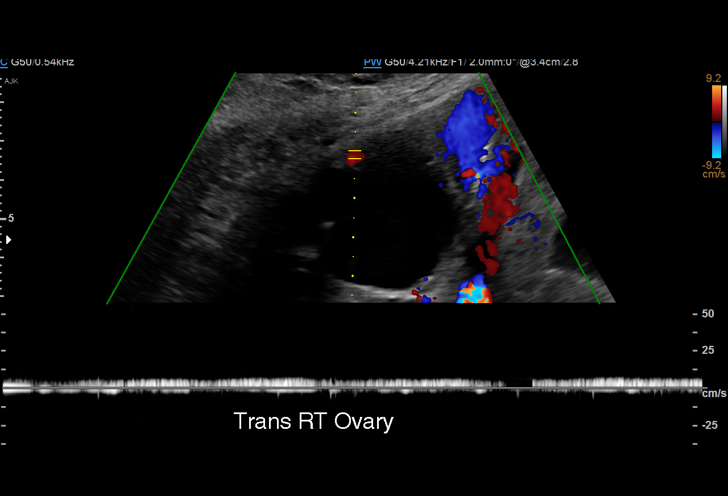
[im 5/30]
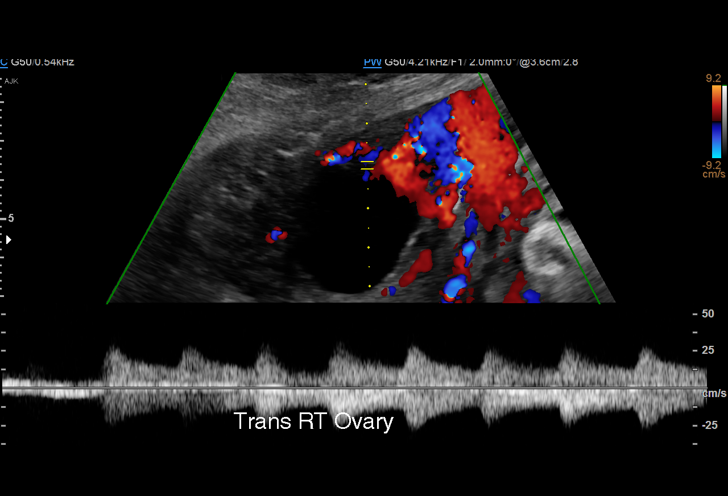
[im 7/30]
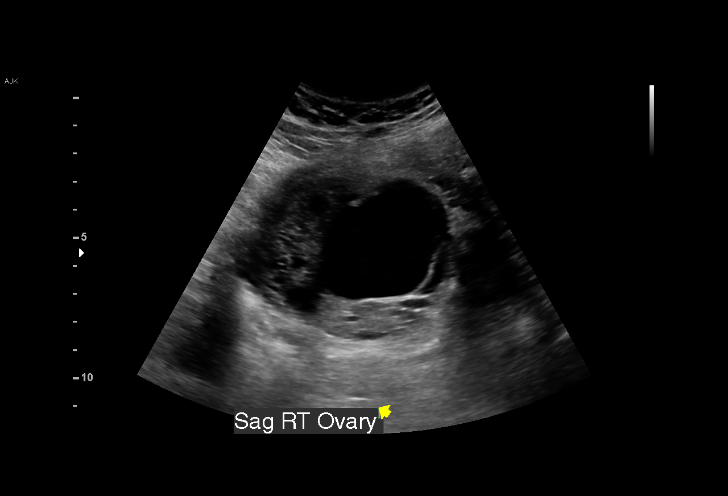
[im 9/30]
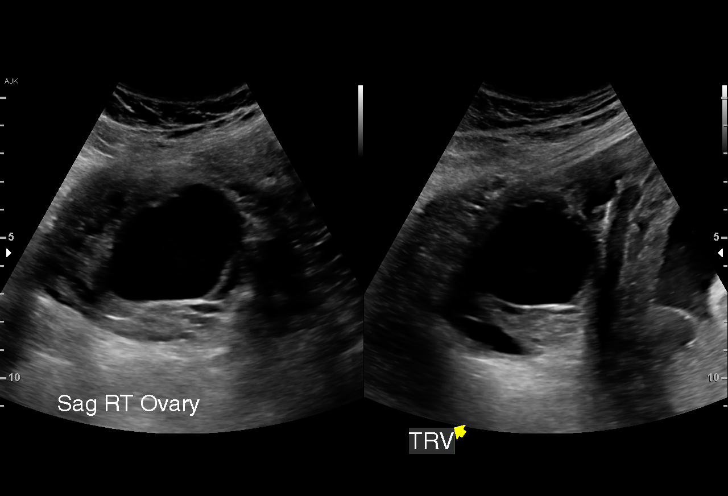
[im 11/30]
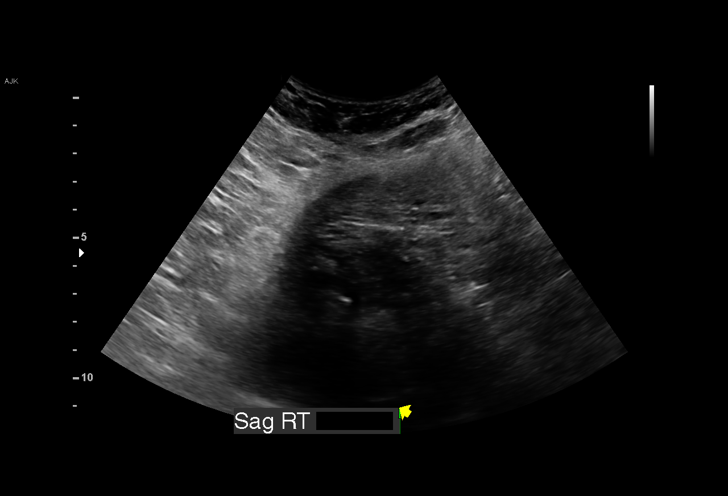
[im 13/30]
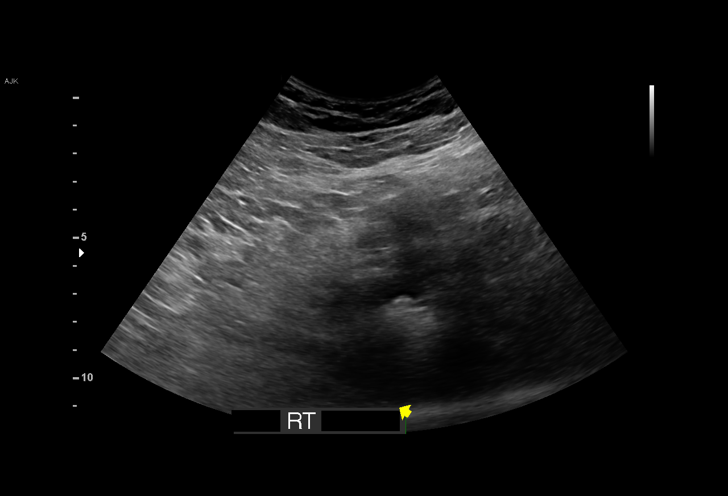
[im 15/30]
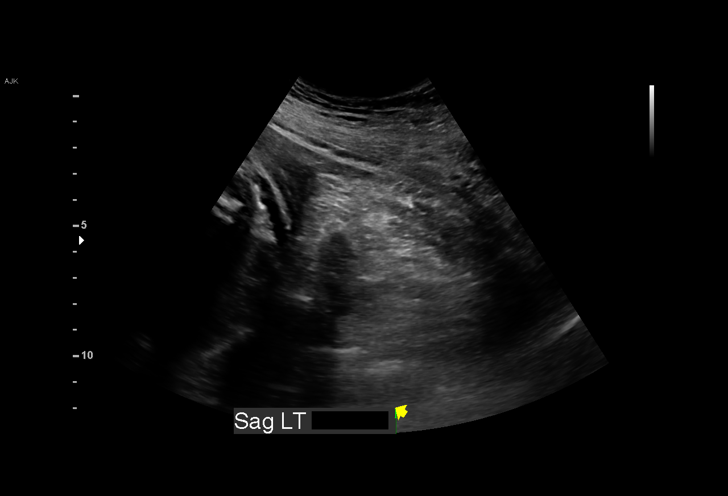
[im 17/30]
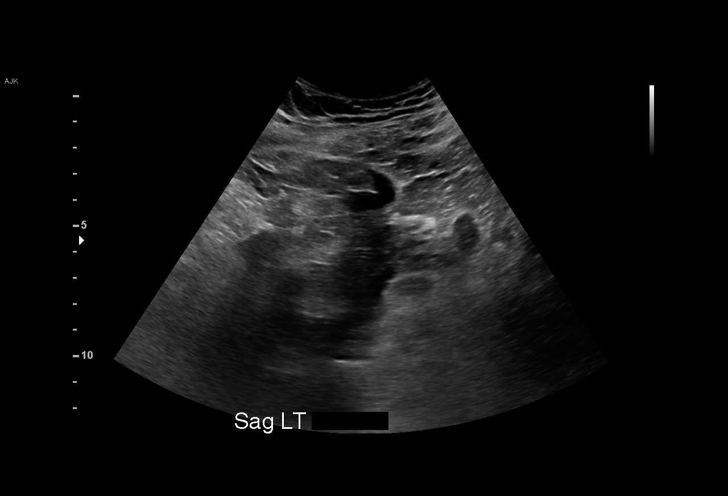
[im 19/30]
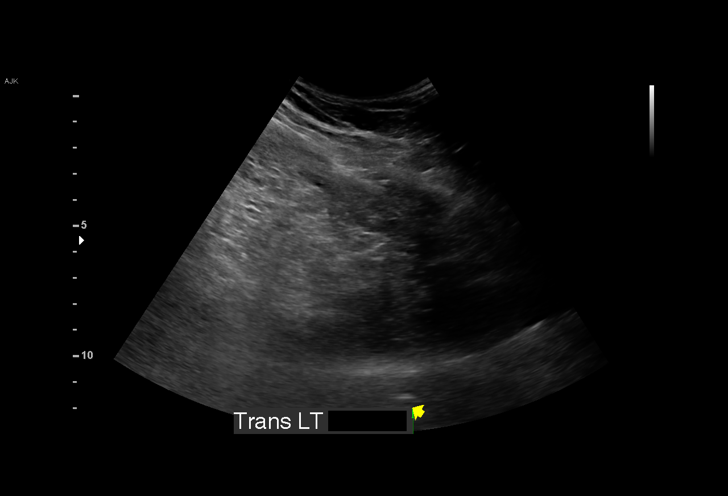
[im 21/30]
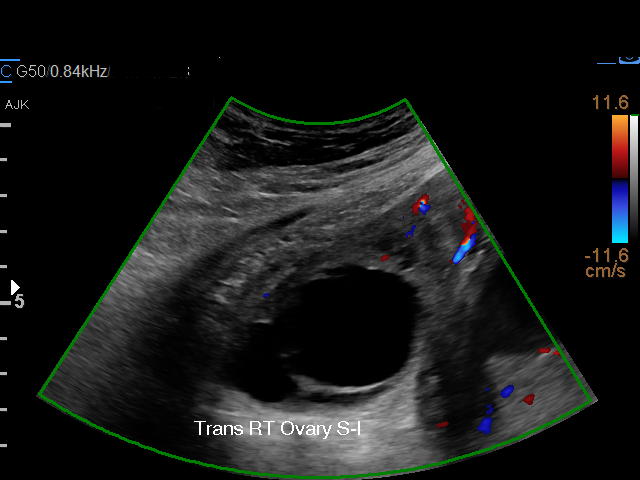
[im 23/30]
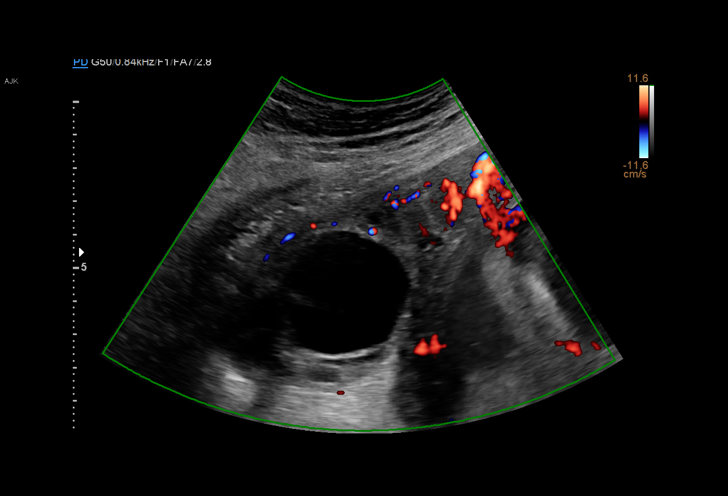
[im 25/30]
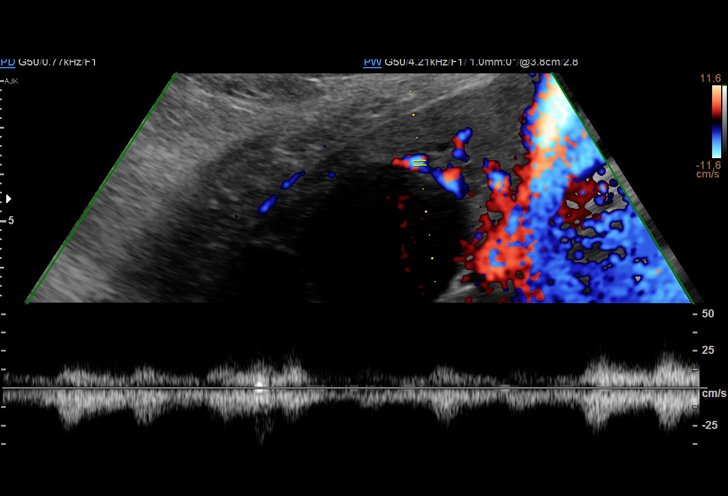
[im 27/30]
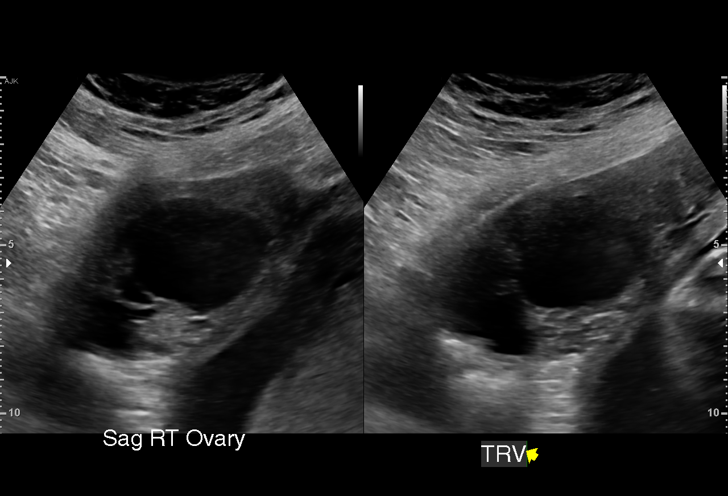
[im 30/30]
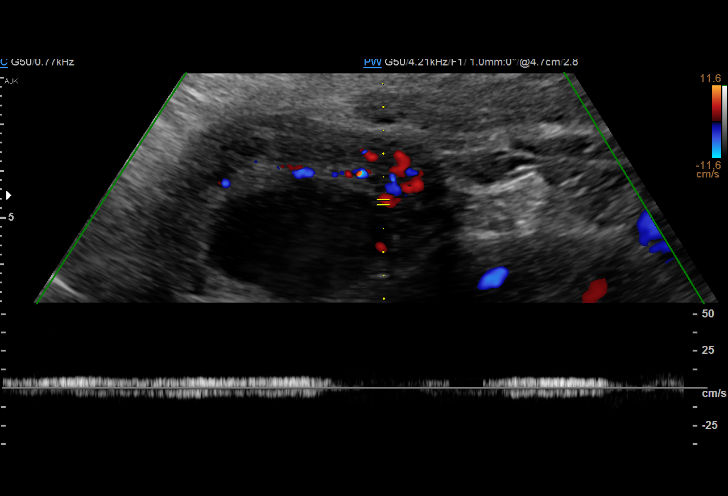

[15 of 25 positions shown; findings below may reference images not displayed]

FINDINGS: Right ovary

Measurements: 8.6 x 5.3 x 6.4 cm = volume: 152.4 mL. 5.3 x 3.9 x
cm anechoic cyst is seen in the right ovary without significant
internal complexity. Additional smaller cysts/follicles in the right
ovary.

Pulsed Doppler evaluation demonstrates normal low-resistance
arterial and venous waveforms in the right ovary.

Left ovary

Not visualized.

Other: Gravid patient. Evaluation of the uterus and fetal anatomy
was not performed.
IMPRESSION: 5.3 x 3 x 9 x 4.6 cm anechoic cyst in the right ovary without
significant internal complexity. Normal color flow and waveforms to
the right ovary without sonographic features of ovarian torsion.
Recommend follow-up US in 3-6 months. Note: This recommendation does
not apply to premenarchal patients or to those with increased risk
(genetic, family history, elevated tumor markers or other high-risk
factors) of ovarian cancer. Reference: Radiology [DATE]):[id number].

Nonvisualization of the left ovary.

This exam is performed on an emergent basis and is not intended to
comprehensively evaluate patient's gestational metrics such as fetal
size, dating, or anatomy; follow-up complete OB US should be
considered if further fetal assessment is warranted.

## 2022-09-21 IMAGING — US US MFM FETAL BPP W/O NON-STRESS
1 series · 12 of 21 positions shown · non-contrast
Comparison: none

[Series 1: us mfm fetal bpp w/o non-stress · 21 acquisitions, 12 frames shown]
[im 1/21]
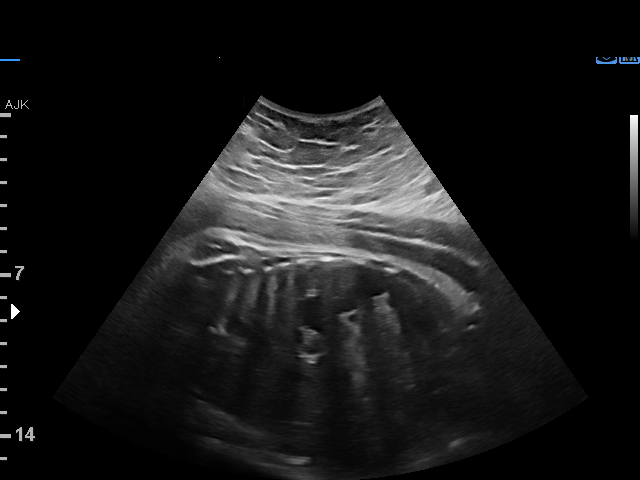
[im 3/21]
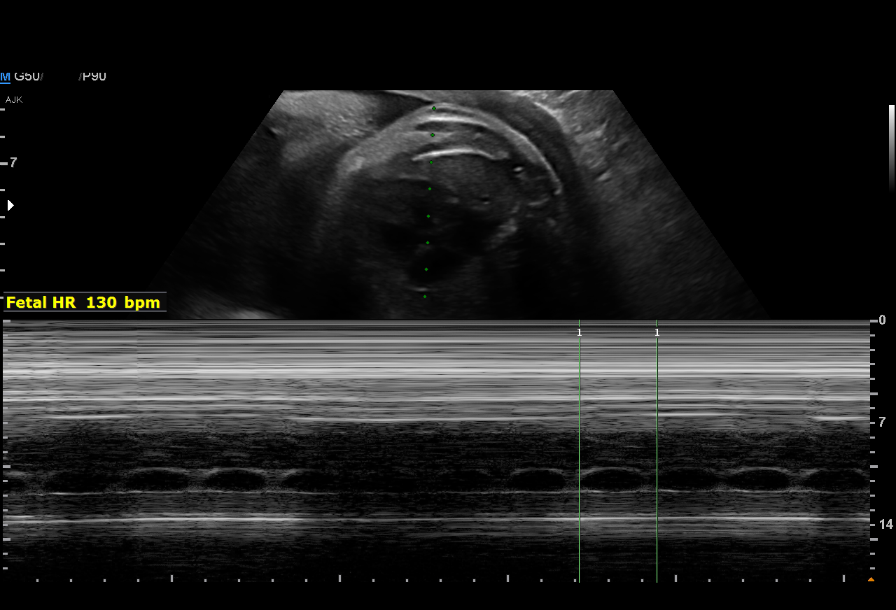
[im 5/21]
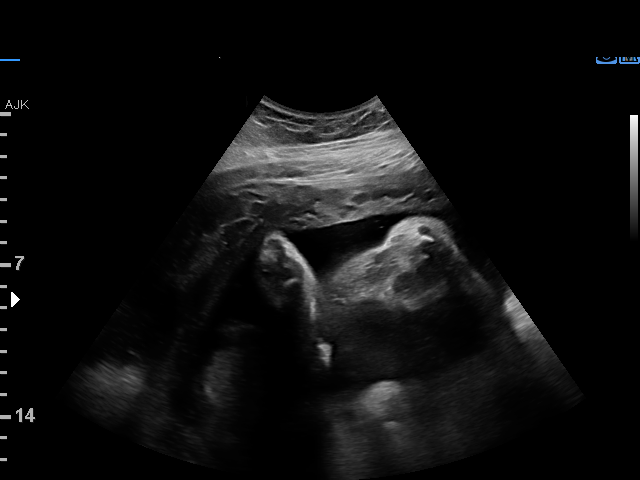
[im 7/21]
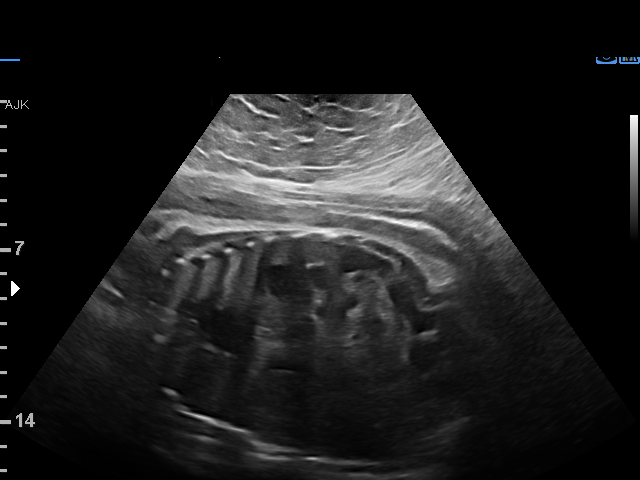
[im 8/21]
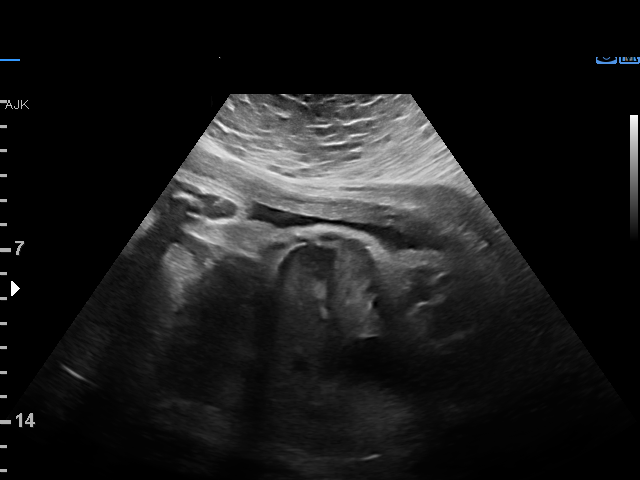
[im 10/21]
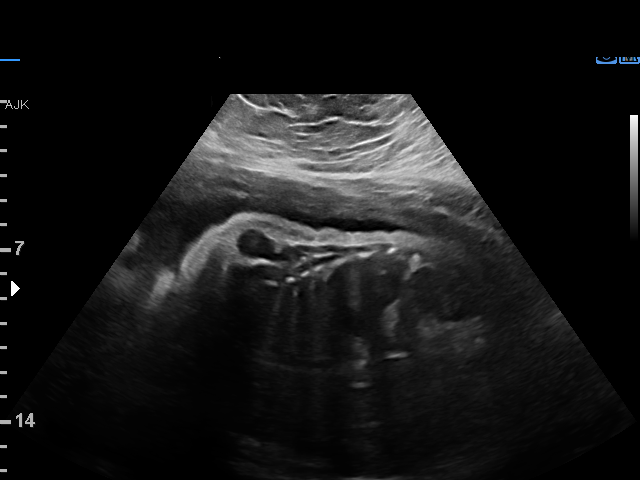
[im 12/21]
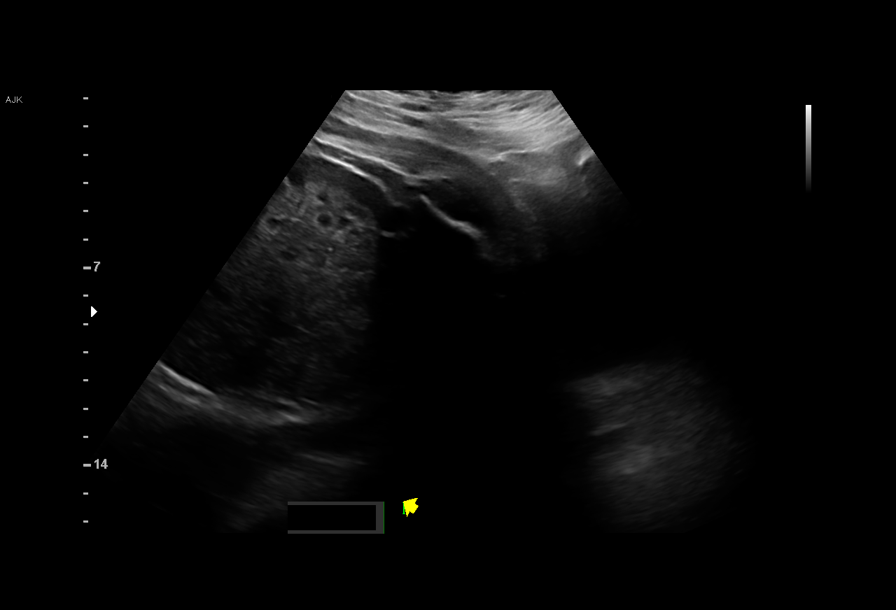
[im 14/21]
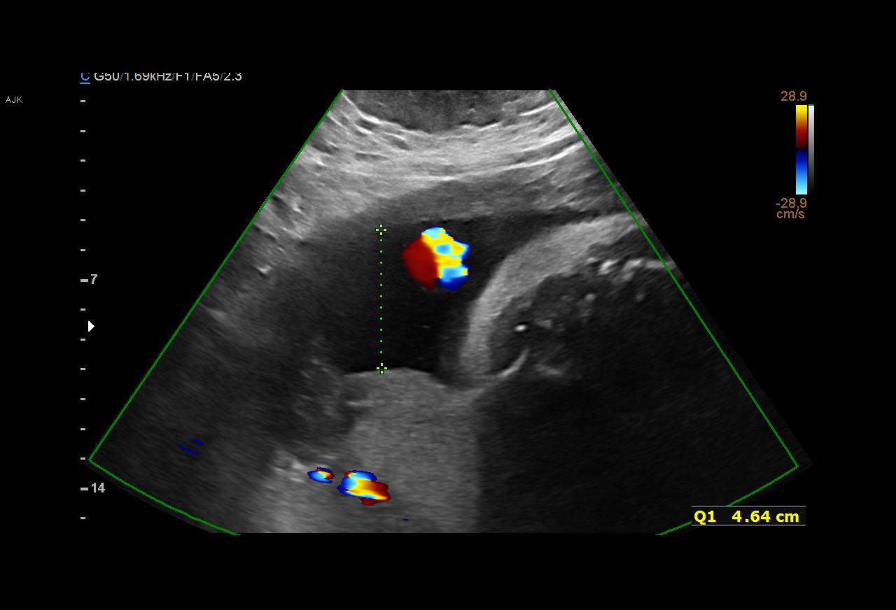
[im 15/21]
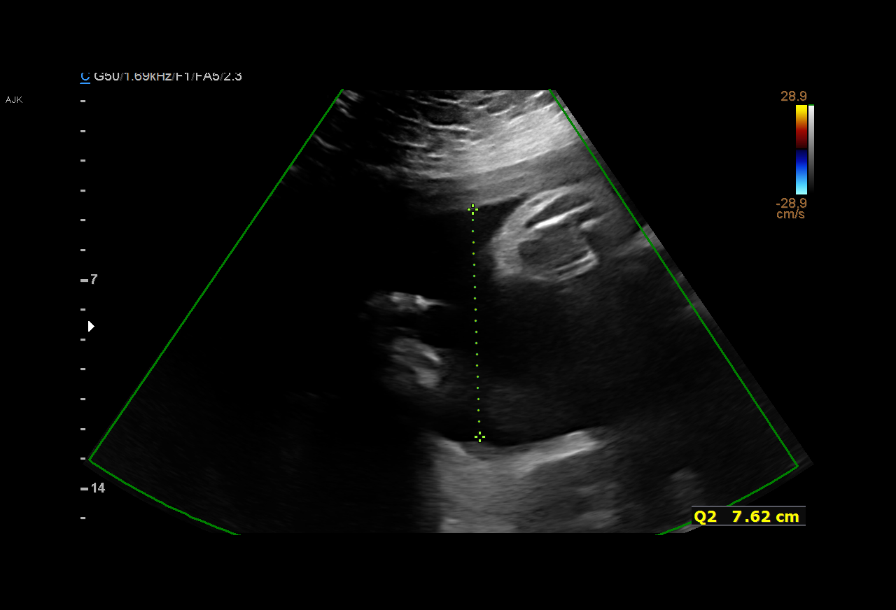
[im 17/21]
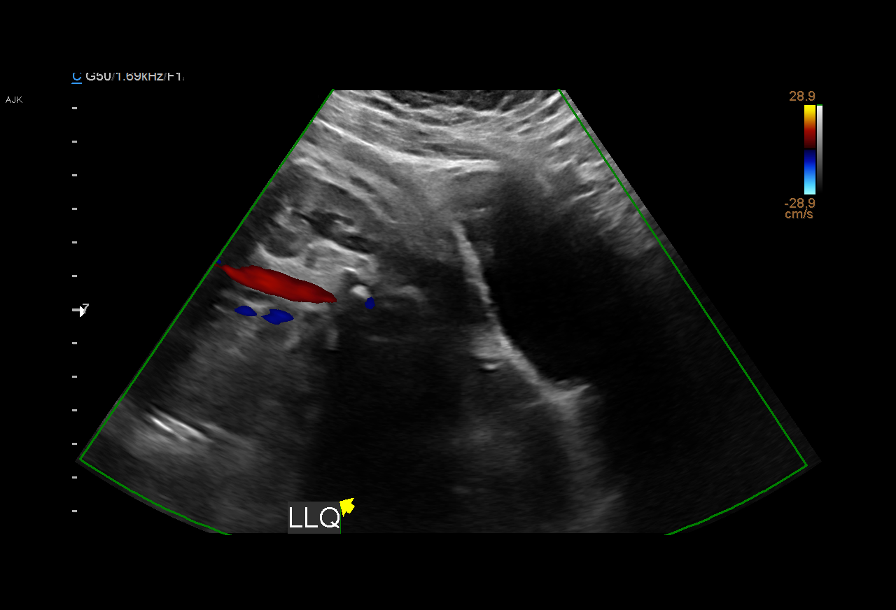
[im 19/21]
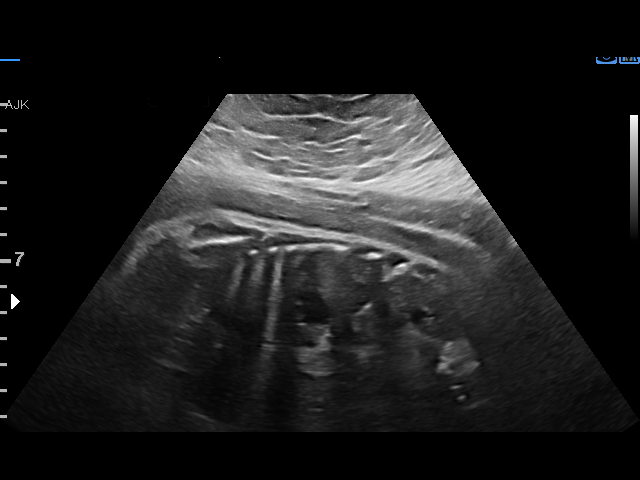
[im 21/21]
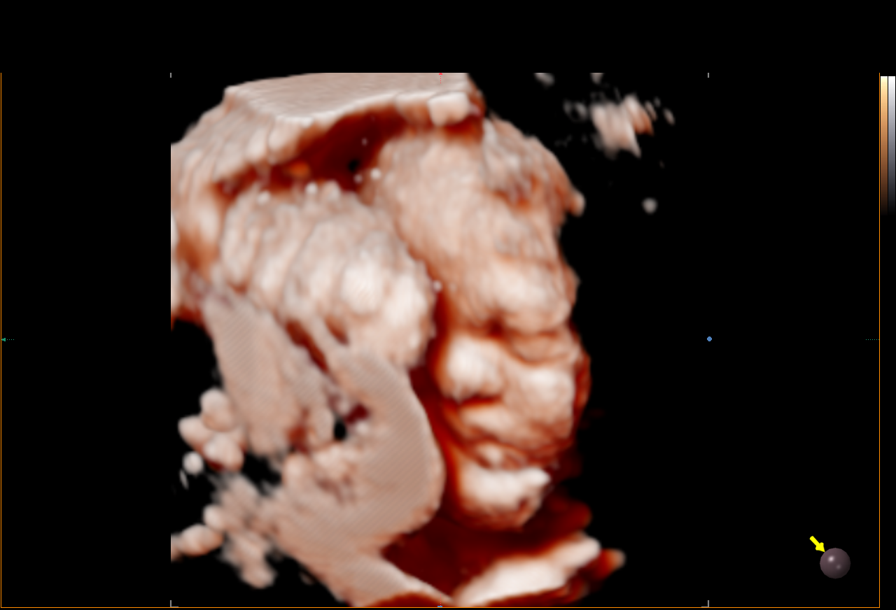

[12 of 21 positions shown; findings below may reference images not displayed]

#101

Indications

 Gestational diabetes in pregnancy,
 controlled by oral hypoglycemic drugs
 (metformin)
 Hypertension - Chronic/Pre-existing
 (procardia)
 35 weeks gestation of pregnancy
 Maternal morbid obesity
Fetal Evaluation

 Num Of Fetuses:         1
 Fetal Heart Rate(bpm):  130
 Cardiac Activity:       Observed
 Presentation:           Breech

 Amniotic Fluid
 AFI FV:      Within normal limits

 AFI Sum(cm)     %Tile       Largest Pocket(cm)
 17.7            66

 RUQ(cm)                     LUQ(cm)        LLQ(cm)

Biophysical Evaluation

 Amniotic F.V:   Within normal limits       F. Tone:        Observed
 F. Movement:    Observed                   Score:          [DATE]
 F. Breathing:   Not Observed
OB History
 Gravidity:    3         Term:   1         SAB:   1
Gestational Age

 Clinical EDD:  35w 3d                                        EDD:   10/17/20
 Best:          35w 3d     Det. By:  Clinical EDD             EDD:   10/17/20
Cervix Uterus Adnexa

 Cervix
 Length:           3.77  cm.
Comments

 This patient has been hospitalized due to lower abdominal
 pain.  Her pregnancy has also been complicated by
 gestational diabetes and chronic hypertension.
 A biophysical profile performed today was [DATE].  She
 received a -2 for fetal breathing movements that did not meet
 criteria.  She has had a reactive nonstress test.
 There was normal amniotic fluid noted on today's ultrasound
 exam.
 The fetus is in the breech presentation.

## 2022-09-22 IMAGING — US US MFM FETAL BPP W/O NON-STRESS
1 series · 13 of 27 positions shown · non-contrast
Comparison: none

[Series 1: us mfm fetal bpp w/o non-stress · 27 acquisitions, 13 frames shown]
[im 2/27]
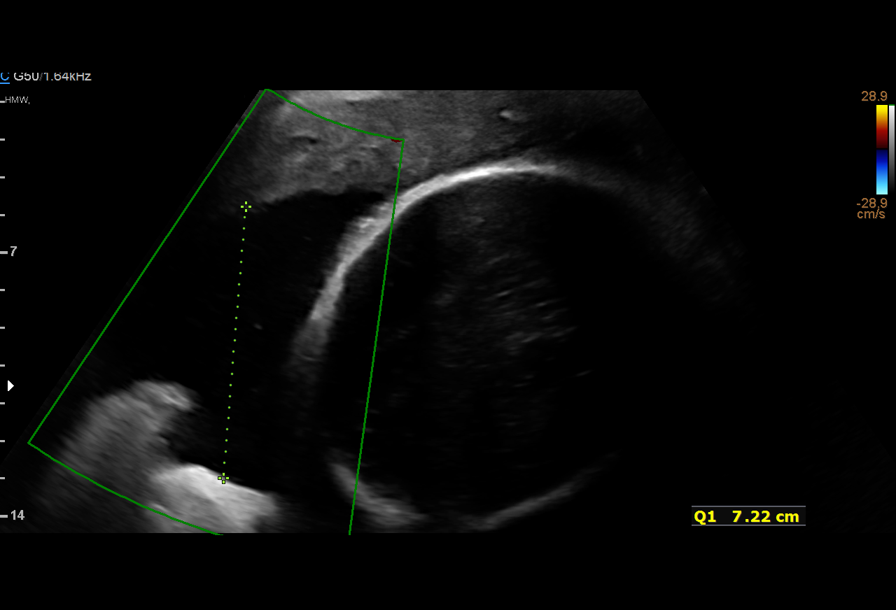
[im 4/27]
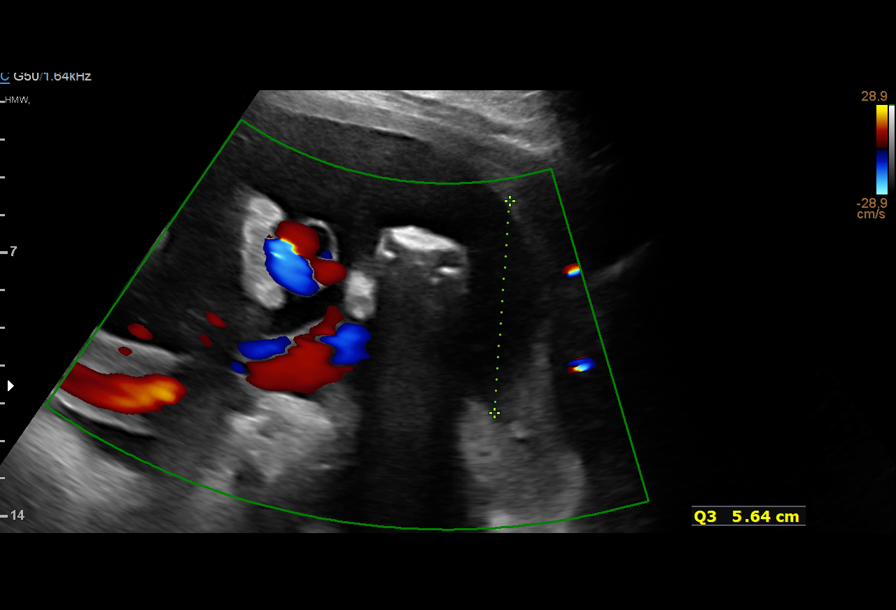
[im 6/27]
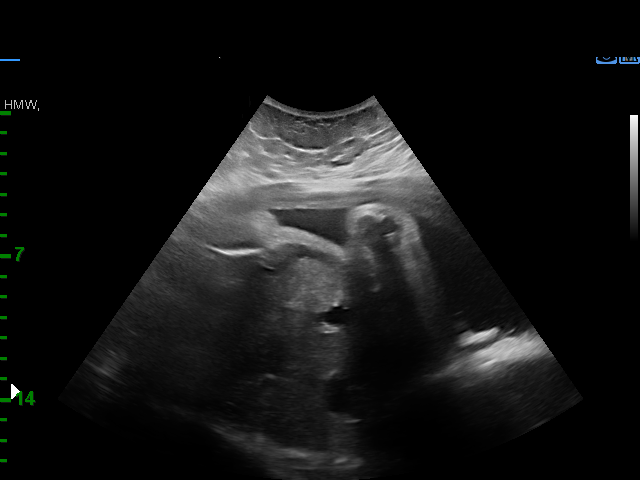
[im 8/27]
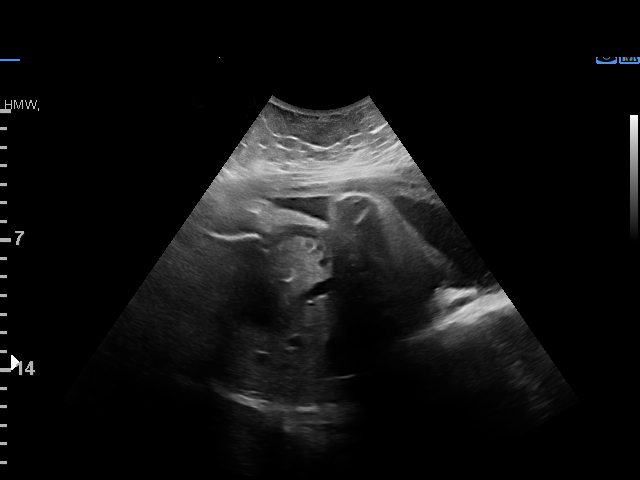
[im 10/27]
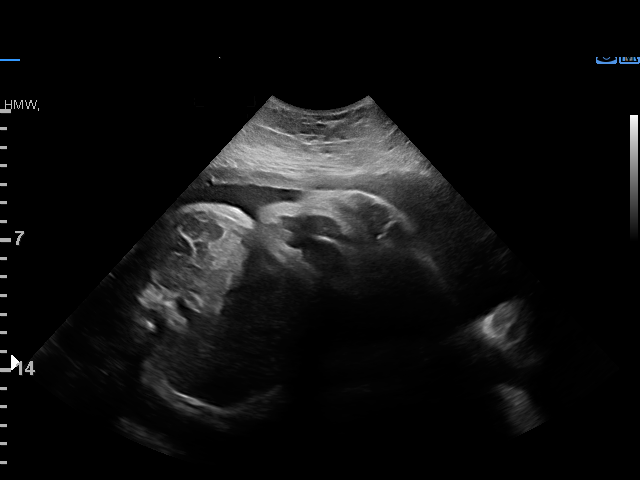
[im 12/27]
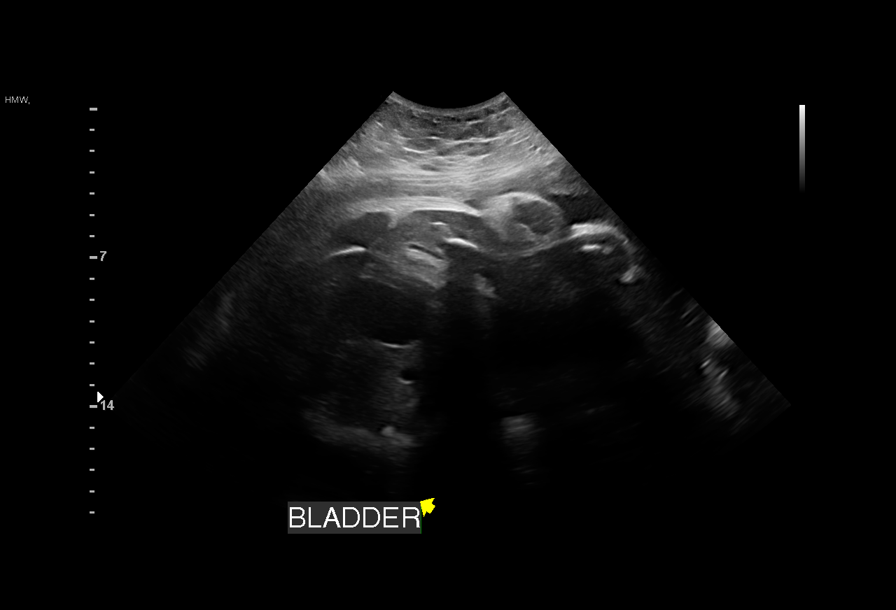
[im 14/27]
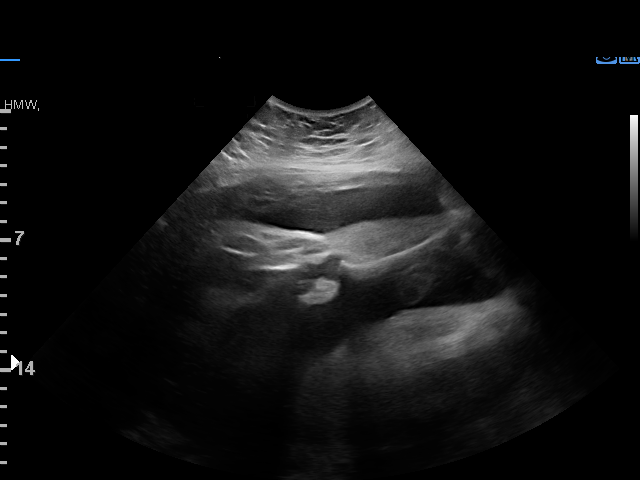
[im 16/27]
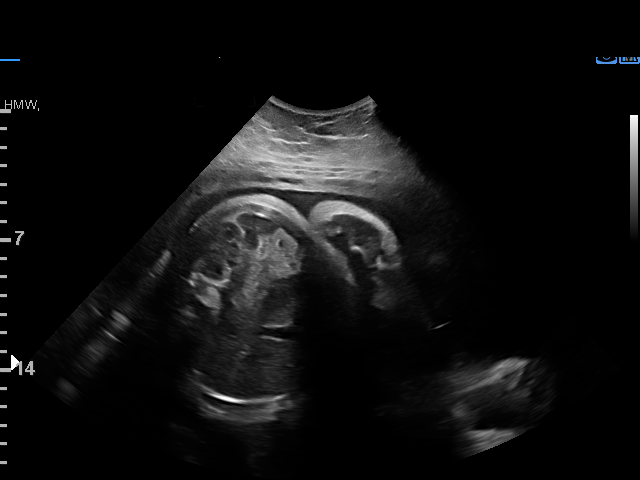
[im 18/27]
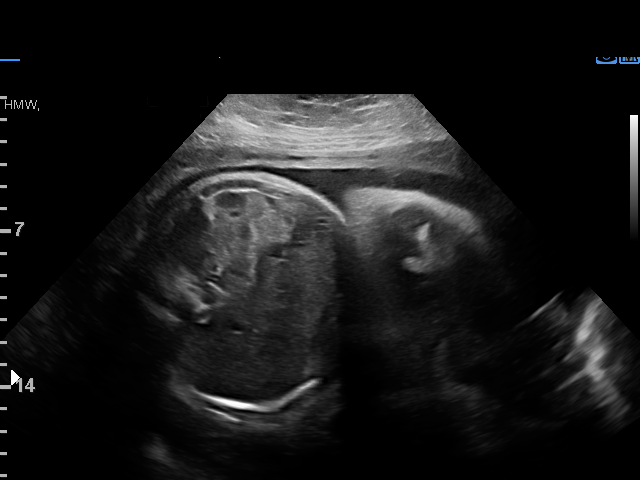
[im 20/27]
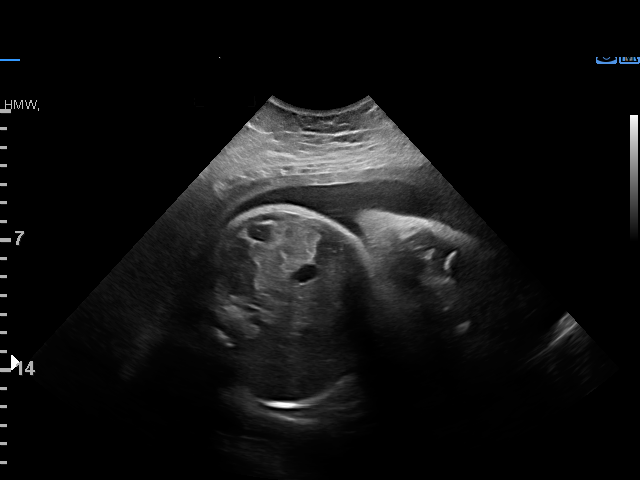
[im 22/27]
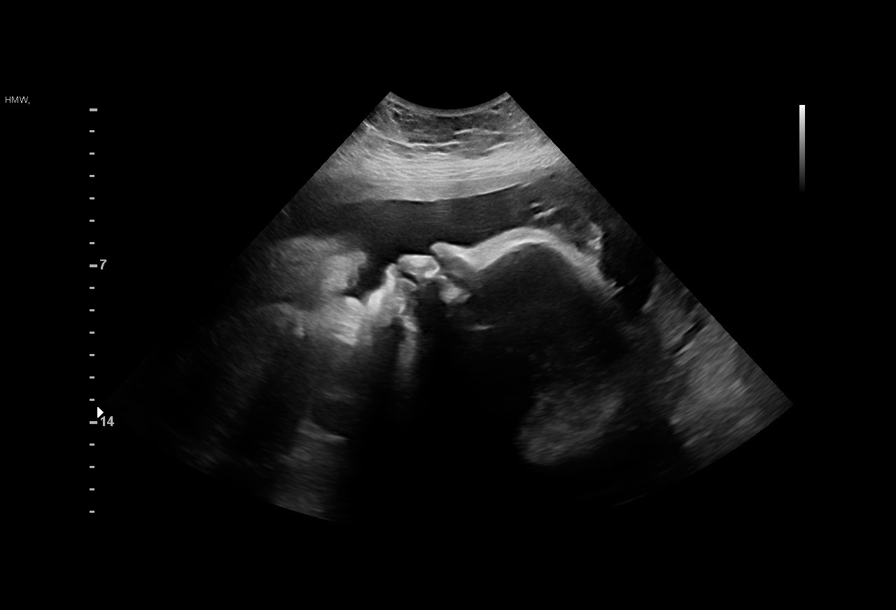
[im 24/27]
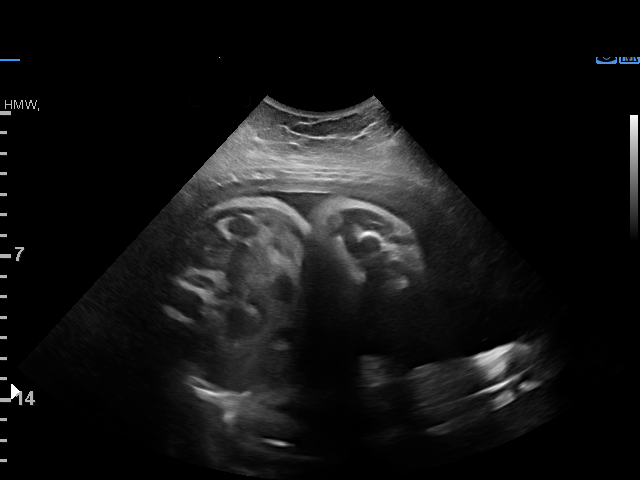
[im 26/27]
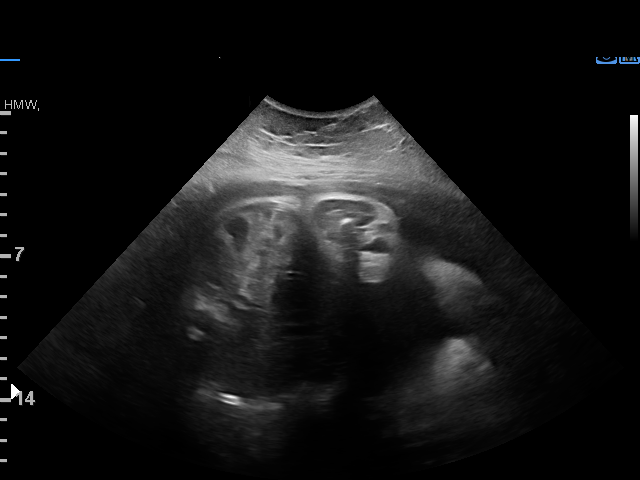

[13 of 27 positions shown; findings below may reference images not displayed]

#101

Indications

 Gestational diabetes in pregnancy,
 controlled by oral hypoglycemic drugs
 (metformin)
 35 weeks gestation of pregnancy
 Encounter for antenatal screening,
 unspecified
 Hypertension - Chronic/Pre-existing
 (procardia)
 Maternal morbid obesity
Fetal Evaluation

 Num Of Fetuses:         1
 Fetal Heart Rate(bpm):  133
 Cardiac Activity:       Observed
 Presentation:           Cephalic

 Amniotic Fluid
 AFI FV:      Within normal limits

 AFI Sum(cm)     %Tile       Largest Pocket(cm)
 14.9            54

 RUQ(cm)       RLQ(cm)       LUQ(cm)        LLQ(cm)
 7.2           2.1           0
Biophysical Evaluation

 Amniotic F.V:   Within normal limits       F. Tone:        Observed
 F. Movement:    Observed                   Score:          [DATE]
 F. Breathing:   Observed
OB History

 Gravidity:    3         Term:   1         SAB:   1
Gestational Age

 Clinical EDD:  35w 4d                                        EDD:   10/17/20
 Best:          35w 4d     Det. By:  Clinical EDD             EDD:   10/17/20
Anatomy

 Thoracic:              Appears normal         Bladder:                Appears normal
 Stomach:               Appears normal, left
                        sided
Comments

 This patient has been hospitalized due to lower abdominal
 pain.  She had a biophysical profile yesterday that was [DATE]
 A biophysical profile performed today was [DATE].
 There was normal amniotic fluid noted on today's ultrasound
 exam.
 The fetus is now in the vertex presentation.

## 2022-11-07 ENCOUNTER — Other Ambulatory Visit: Payer: Self-pay | Admitting: Obstetrics and Gynecology

## 2022-11-07 DIAGNOSIS — Z362 Encounter for other antenatal screening follow-up: Secondary | ICD-10-CM

## 2022-11-07 DIAGNOSIS — Z363 Encounter for antenatal screening for malformations: Secondary | ICD-10-CM

## 2022-11-10 ENCOUNTER — Encounter: Payer: Self-pay | Admitting: Cardiology

## 2022-11-10 ENCOUNTER — Ambulatory Visit (INDEPENDENT_AMBULATORY_CARE_PROVIDER_SITE_OTHER): Payer: 59 | Admitting: Cardiology

## 2022-11-10 VITALS — BP 162/92 | HR 93 | Ht 62.0 in | Wt 267.4 lb

## 2022-11-10 DIAGNOSIS — R0789 Other chest pain: Secondary | ICD-10-CM | POA: Diagnosis not present

## 2022-11-10 DIAGNOSIS — R002 Palpitations: Secondary | ICD-10-CM | POA: Diagnosis not present

## 2022-11-10 DIAGNOSIS — R0609 Other forms of dyspnea: Secondary | ICD-10-CM

## 2022-11-10 DIAGNOSIS — O1003 Pre-existing essential hypertension complicating the puerperium: Secondary | ICD-10-CM

## 2022-11-10 DIAGNOSIS — Z3A00 Weeks of gestation of pregnancy not specified: Secondary | ICD-10-CM

## 2022-11-10 MED ORDER — NIFEDIPINE ER OSMOTIC RELEASE 30 MG PO TB24: 30.0000 mg | ORAL_TABLET | Freq: Every day | ORAL | 6 refills | Status: DC

## 2022-11-10 NOTE — Progress Notes (Unsigned)
Cardio-Obstetrics Clinic  New Evaluation  Date:  11/13/2022   ID:  Sheryl Velasquez, DOB 1990/05/19, MRN [id number]  PCP:  Layne Benton., MD   [facility] Providers Cardiologist:  Thomasene Ripple, DO  Electrophysiologist:  None       Referring MD: Arby Barrette, FNP   Chief Complaint: ' I am having chest pain"  History of Present Illness:    Sheryl Velasquez is a 32 y.o. female [G3P2012] who is being seen today for the evaluation of cardiac at the request of Arby Barrette, FNP.   Medical history include PCOS, Diabetes mellitus, Hypertension and obesity.   She report intermittent chest discomfort. She describes this as a Midsternal pain and tightness. Comes and goes. But recently she has been experiencing with this worsening shortness of breath that starts after the abrupt onset of fast heart beat. Though she notes the shortness of breath in occurring at rest.  She did not experienced this with her previous pregnancies.   Prior CV Studies Reviewed: The following studies were reviewed today: None today   Past Medical History:  Diagnosis Date   Diabetes mellitus without complication (HCC)    Fatty liver    Gestational diabetes    Hypertension    PCOS (polycystic ovarian syndrome)     Past Surgical History:  Procedure Laterality Date   CESAREAN SECTION N/A 10/02/2020   Procedure: CESAREAN SECTION;  Surgeon: Carlisle Cater, MD;  Location: [facility];  Service: Obstetrics;  Laterality: N/A;   CHOLECYSTECTOMY  2015   LAPAROSCOPIC UNILATERAL SALPINGO OOPHERECTOMY Right 04/20/2022   Procedure: LAPAROSCOPIC RIGHT SALPINGO-OOPHORECTOMY;  Surgeon: Carlisle Cater, MD;  [facility]: [facility];  Service: Gynecology;  Laterality: Right;   WISDOM TOOTH EXTRACTION  2011      OB History     Gravida  3   Para  2   Term  2   Preterm      AB  1   Living  2      SAB  1   IAB      Ectopic      Multiple  0   Live Births  2                Current Medications: Current Meds  Medication Sig   aspirin EC 81 MG tablet Take 81 mg by mouth daily. Swallow whole.   labetalol (NORMODYNE) 100 MG tablet Take 400 mg by mouth once.   metFORMIN (GLUCOPHAGE-XR) 500 MG 24 hr tablet Take 1,000 mg by mouth at bedtime. 750 mg in the morning 1000 mg at night   NIFEdipine (PROCARDIA-XL/NIFEDICAL-XL) 30 MG 24 hr tablet Take 1 tablet (30 mg total) by mouth daily.     Allergies:   Patient has no known allergies.   Social History   Socioeconomic History   Marital status: Married    Spouse name: Not on file   Number of children: Not on file   Years of education: Not on file   Highest education level: Not on file  Occupational History   Not on file  Tobacco Use   Smoking status: Never   Smokeless tobacco: Never  Vaping Use   Vaping status: Never Used  Substance and Sexual Activity   Alcohol use: Never   Drug use: Never   Sexual activity: Yes  Other Topics Concern   Not on file  Social History Narrative   Not on file   Social Determinants of Health  Financial Resource Strain: Not on file  Food Insecurity: No Food Insecurity (02/23/2022)   Received from [facility], [facility] visits prior to 06/24/2022., [facility] visits prior to 06/24/2022., [facility]   Hunger Vital Sign    Worried About Running Out of Food in the Last Year: Never true    Ran Out of Food in the Last Year: Never true  Transportation Needs: No Transportation Needs (02/23/2022)   [facility] from [facility] visits prior to 06/24/2022[facility], [facility] visits prior to 06/24/2022., [facility]   PRAPARE - Transportation    Lack of Transportation (Medical): No    Lack of Transportation (Non-Medical): No  Physical Activity: Not on file  Stress: Not on file  Social Connections: Not on file      Family History  Problem Relation Age of Onset   Diabetes  Mother    Hypertension Mother    Hypertension Father    Hypertension Maternal Grandfather        Passed away from uncontrolled hypertension that led to multiple strokes   Anemia Paternal Grandmother    Arrhythmia Paternal Grandmother    Heart failure Paternal Grandmother        She had a failed heart vavle she had to have surgery on to fix   Anemia Paternal Aunt    Anemia Sister    Asthma Brother    Hypertension Brother       ROS:   Please see the history of present illness.    Chest pain  All other systems reviewed and are negative.   Labs/EKG Reviewed:    EKG:   EKG was not ordered today.    Recent Labs: 04/20/2022: Hemoglobin 8.9; Platelets 304   Recent Lipid Panel No results found for: "CHOL", "TRIG", "HDL", "CHOLHDL", "LDLCALC", "LDLDIRECT"  Physical Exam:    VS:  BP (!) 162/92 (BP Location: Left Arm, Patient Position: Sitting, Cuff Size: Large)   Pulse 93   Ht 5\' 2"  (1.575 m)   Wt 267 lb 6.4 oz (121.3 kg)   SpO2 97%   BMI 48.91 kg/m     Wt Readings from Last 3 Encounters:  11/10/22 267 lb 6.4 oz (121.3 kg)  04/20/22 263 lb 14.4 oz (119.7 kg)  01/17/22 266 lb (120.7 kg)     GEN:  Well nourished, well developed in no acute distress HEENT: Normal NECK: No JVD; No carotid bruits LYMPHATICS: No lymphadenopathy CARDIAC: RRR, no murmurs, rubs, gallops RESPIRATORY:  Clear to auscultation without rales, wheezing or rhonchi  ABDOMEN: Soft, non-tender, non-distended MUSCULOSKELETAL:  No edema; No deformity  SKIN: Warm and dry NEUROLOGIC:  Alert and oriented x 3 PSYCHIATRIC:  Normal affect    Risk Assessment/Risk Calculators:     CARPREG II Risk Prediction Index Score:  1.  The patient's risk for a primary cardiac event is 5%.            ASSESSMENT & PLAN:     Hypertensive today in the office - bp taken by manually 162/92 mmHg. Have asked the patient to take an extra dose of Labetalol.  I will start Nifedipine 30mg  daily and see if she tolerates  this. She has increased palpitations previously. This recurs will switch her to Amlodipine or hydralazine.   She states that taking her labetalol TID make her tired and sleepy.   She will follow with our Cardio-Ob pharmacist for antihypertensive medication optimization .  Will get an echo to rule of structural abnormalities in the setting of her shortness of breath at rest.   Zio to rule out arrhythmia - family hx with arrhythmia ( she is not sure what)  Keep weight gain to 11-20lbs during prenacny.   She will  Patient Instructions  Medication Instructions:   Start taking Nifedipine 30 mg daily   *If you need a refill on your cardiac medications before your next appointment, please call your pharmacy*   Lab Work: Not needed    Testing/Procedures:  Will be schedule at [facility] [address] Your physician has requested that you have an echocardiogram. Echocardiography is a painless test that uses sound waves to create images of your heart. It provides your doctor with information about the size and shape of your heart and how well your heart's chambers and valves are working. This procedure takes approximately one hour. There are no restrictions for this procedure. Please do NOT wear cologne, perfume, aftershave, or lotions (deodorant is allowed). Please arrive 15 minutes prior to your appointment time.   And. Your physician has recommended that you wear a holter monitor - 7 day Zio . Holter monitors are medical devices that record the heart's electrical activity. Doctors most often use these monitors to diagnose arrhythmias. Arrhythmias are problems with the speed or rhythm of the heartbeat. The monitor is a small, portable device. You can wear one while you do your normal daily activities. This is usually used to diagnose what is causing palpitations/syncope (passing out).  Follow-Up: At [facility], you and your health needs are our priority.  As part of our  continuing mission to provide you with exceptional heart care, we have created designated Provider Care Teams.  These Care Teams include your primary Cardiologist (physician) and Advanced Practice Providers (APPs -  Physician Assistants and Nurse Practitioners) who all work together to provide you with the care you need, when you need it.     Your next appointment:   6 week(s)   The format for your next appointment:   In Person  Provider:   Thomasene Ripple, DO   You have been referred to  [facility]  pharmacist - ( hypertension in pregnancy - medication  Titration )    Other Instructions  ZIO XT- Long Term Monitor Instructions  Your physician has requested you wear a ZIO patch monitor for 7 days.  This is a single patch monitor. [employer] supplies one patch monitor per enrollment. Additional stickers are not available. Please do not apply patch if you will be having a Nuclear Stress Test,  Echocardiogram, Cardiac CT, MRI, or Chest Xray during the period you would be wearing the  monitor. The patch cannot be worn during these tests. You cannot remove and re-apply the  ZIO XT patch monitor.  Your ZIO patch monitor will be mailed 3 day USPS to your address on file. It may take 3-5 days  to receive your monitor after you have been enrolled.  Once you have received your monitor, please review the enclosed instructions. Your monitor  has already been registered assigning a specific monitor serial # to you.  Billing and Patient Assistance Program Information  We have supplied [employer] with any of your insurance information on file for billing purposes. [employer] offers a sliding scale Patient Assistance Program for patients that do not have  insurance, or whose insurance does not completely cover the cost of the ZIO monitor.  You must apply for the Patient  Assistance Program to qualify for this discounted rate.  To apply, please call [employer] at [phone], select option 4, select option 2, ask to  apply for  Patient Assistance Program. Meredeth Ide will ask your household income, and how many people  are in your household. They will quote your out-of-pocket cost based on that information.  [employer] will also be able to set up a 38-month, interest-free payment plan if needed.  Applying the monitor   Shave hair from upper left chest.  Hold abrader disc by orange tab. Rub abrader in 40 strokes over the upper left chest as  indicated in your monitor instructions.  Clean area with 4 enclosed alcohol pads. Let dry.  Apply patch as indicated in monitor instructions. Patch will be placed under collarbone on left  side of chest with arrow pointing upward.  Rub patch adhesive wings for 2 minutes. Remove white label marked "1". Remove the white  label marked "2". Rub patch adhesive wings for 2 additional minutes.  While looking in a mirror, press and release button in center of patch. A small green light will  flash 3-4 times. This will be your only indicator that the monitor has been turned on.  Do not shower for the first 24 hours. You may shower after the first 24 hours.  Press the button if you feel a symptom. You will hear a small click. Record Date, Time and  Symptom in the Patient Logbook.  When you are ready to remove the patch, follow instructions on the last 2 pages of Patient  Logbook. Stick patch monitor onto the last page of Patient Logbook.  Place Patient Logbook in the blue and white box. Use locking tab on box and tape box closed  securely. The blue and white box has prepaid postage on it. Please place it in the mailbox as  soon as possible. Your physician should have your test results approximately 7 days after the  monitor has been mailed back to [facility].  Call [facility] Customer Care at [phone] if you have questions regarding  your ZIO XT patch monitor. Call them immediately if you see an orange light blinking on your  monitor.  If your monitor falls off in  less than 4 days, contact our Monitor department at [phone].  If your monitor becomes loose or falls off after 4 days call [employer] at [phone] for  suggestions on securing your monitor    Dispo:  Return in about 6 weeks (around 12/22/2022).   Medication Adjustments/Labs and Tests Ordered: Current medicines are reviewed at length with the patient today.  Concerns regarding medicines are outlined above.  Tests Ordered: Orders Placed This Encounter  Procedures   AMB Referral to [facility] Pharm-D   LONG TERM MONITOR (3-14 DAYS)   ECHOCARDIOGRAM COMPLETE   Medication Changes: Meds ordered this encounter  Medications   NIFEdipine (PROCARDIA-XL/NIFEDICAL-XL) 30 MG 24 hr tablet    Sig: Take 1 tablet (30 mg total) by mouth daily.    Dispense:  30 tablet    Refill:  6

## 2022-11-10 NOTE — Patient Instructions (Addendum)
Medication Instructions:   Start taking Nifedipine 30 mg daily   *If you need a refill on your cardiac medications before your next appointment, please call your pharmacy*   Lab Work: Not needed    Testing/Procedures:  Will be schedule at [facility] [address] Your physician has requested that you have an echocardiogram. Echocardiography is a painless test that uses sound waves to create images of your heart. It provides your doctor with information about the size and shape of your heart and how well your heart's chambers and valves are working. This procedure takes approximately one hour. There are no restrictions for this procedure. Please do NOT wear cologne, perfume, aftershave, or lotions (deodorant is allowed). Please arrive 15 minutes prior to your appointment time.   And. Your physician has recommended that you wear a holter monitor - 7 day Zio . Holter monitors are medical devices that record the heart's electrical activity. Doctors most often use these monitors to diagnose arrhythmias. Arrhythmias are problems with the speed or rhythm of the heartbeat. The monitor is a small, portable device. You can wear one while you do your normal daily activities. This is usually used to diagnose what is causing palpitations/syncope (passing out).  Follow-Up: At [facility], you and your health needs are our priority.  As part of our continuing mission to provide you with exceptional heart care, we have created designated Provider Care Teams.  These Care Teams include your primary Cardiologist (physician) and Advanced Practice Providers (APPs -  Physician Assistants and Nurse Practitioners) who all work together to provide you with the care you need, when you need it.     Your next appointment:   6 week(s)   The format for your next appointment:   In Person  Provider:   Thomasene Ripple, DO   You have been referred to  [facility]  pharmacist - ( hypertension in pregnancy -  medication  Titration )    Other Instructions  ZIO XT- Long Term Monitor Instructions  Your physician has requested you wear a ZIO patch monitor for 7 days.  This is a single patch monitor. [employer] supplies one patch monitor per enrollment. Additional stickers are not available. Please do not apply patch if you will be having a Nuclear Stress Test,  Echocardiogram, Cardiac CT, MRI, or Chest Xray during the period you would be wearing the  monitor. The patch cannot be worn during these tests. You cannot remove and re-apply the  ZIO XT patch monitor.  Your ZIO patch monitor will be mailed 3 day USPS to your address on file. It may take 3-5 days  to receive your monitor after you have been enrolled.  Once you have received your monitor, please review the enclosed instructions. Your monitor  has already been registered assigning a specific monitor serial # to you.  Billing and Patient Assistance Program Information  We have supplied [employer] with any of your insurance information on file for billing purposes. [employer] offers a sliding scale Patient Assistance Program for patients that do not have  insurance, or whose insurance does not completely cover the cost of the ZIO monitor.  You must apply for the Patient Assistance Program to qualify for this discounted rate.  To apply, please call [employer] at [phone], select option 4, select option 2, ask to apply for  Patient Assistance Program. Meredeth Ide will ask your household income, and how many people  are in your household. They will quote your out-of-pocket cost based on  that information.  [employer] will also be able to set up a 59-month, interest-free payment plan if needed.  Applying the monitor   Shave hair from upper left chest.  Hold abrader disc by orange tab. Rub abrader in 40 strokes over the upper left chest as  indicated in your monitor instructions.  Clean area with 4 enclosed alcohol pads. Let dry.  Apply patch as indicated in  monitor instructions. Patch will be placed under collarbone on left  side of chest with arrow pointing upward.  Rub patch adhesive wings for 2 minutes. Remove white label marked "1". Remove the white  label marked "2". Rub patch adhesive wings for 2 additional minutes.  While looking in a mirror, press and release button in center of patch. A small green light will  flash 3-4 times. This will be your only indicator that the monitor has been turned on.  Do not shower for the first 24 hours. You may shower after the first 24 hours.  Press the button if you feel a symptom. You will hear a small click. Record Date, Time and  Symptom in the Patient Logbook.  When you are ready to remove the patch, follow instructions on the last 2 pages of Patient  Logbook. Stick patch monitor onto the last page of Patient Logbook.  Place Patient Logbook in the blue and white box. Use locking tab on box and tape box closed  securely. The blue and white box has prepaid postage on it. Please place it in the mailbox as  soon as possible. Your physician should have your test results approximately 7 days after the  monitor has been mailed back to [facility].  Call [facility] Customer Care at [phone] if you have questions regarding  your ZIO XT patch monitor. Call them immediately if you see an orange light blinking on your  monitor.  If your monitor falls off in less than 4 days, contact our Monitor department at [phone].  If your monitor becomes loose or falls off after 4 days call [employer] at [phone] for  suggestions on securing your monitor

## 2022-11-13 ENCOUNTER — Ambulatory Visit: Payer: 59 | Attending: Cardiology

## 2022-11-13 DIAGNOSIS — R002 Palpitations: Secondary | ICD-10-CM

## 2022-11-13 NOTE — Progress Notes (Unsigned)
Enrolled for [employer] to mail a ZIO XT long term holter monitor to the patients address on file.  

## 2022-11-15 ENCOUNTER — Encounter: Payer: Self-pay | Admitting: Pharmacist

## 2022-11-15 ENCOUNTER — Ambulatory Visit: Payer: 59 | Admitting: Pharmacist

## 2022-11-15 VITALS — BP 118/72 | HR 106 | Ht 62.0 in | Wt 269.2 lb

## 2022-11-15 DIAGNOSIS — O169 Unspecified maternal hypertension, unspecified trimester: Secondary | ICD-10-CM | POA: Insufficient documentation

## 2022-11-15 DIAGNOSIS — I10 Essential (primary) hypertension: Secondary | ICD-10-CM | POA: Diagnosis not present

## 2022-11-15 NOTE — Patient Instructions (Addendum)
It was nice meeting you today  Your blood pressure is excellent today  Please continue your nifedipine 30mg  one a day and labetalol 200mg  twice a day  Continue to watch your salt intake  I will see you back in a few weeks  Laural Golden, PharmD, BCACP, CDCES, CPP [address], [address] [place], [place], [address] Phone: [phone], Fax: [phone]

## 2022-11-15 NOTE — Progress Notes (Unsigned)
Patient ID: Deondra Labrador                 DOB: 24-Aug-1990                      MRN: [id number]     HPI: Sheryl Velasquez is a 32 y.o. female referred by Dr. Servando Salina to HTN clinic. PMH is significant for HTN, GDM, PCOS and obesity.   Patient presents today in good spirits and high energy. Reports typically has high energy and a rapid heart rate.  OB/GYN is Ellison Hughs MD at [facility].  Patient currently [redacted] weeks pregnant. This is patient's 4th pregnancy. Two living children (1st female, 2nd female, current female).  EDD 03/19/23.  Forgot to bring home BP log. Reports by memory BP typically ~135/80s-90s. Continues to have chest pain once daily. Echo scheduled for 12/08/22  Diagnosed with GDM, currently managed on metformin and diet. Is not being physically active due to chest pain concerns. Does not believe she has proteinuria.   BP managed with nifedipine 30mg  and labetalol 200mg  BID. No patient reported adverse effects.  Current HTN meds:  Nifedipine 30mg  daily Labetalol 200mg  BID   Wt Readings from Last 3 Encounters:  11/10/22 267 lb 6.4 oz (121.3 kg)  04/20/22 263 lb 14.4 oz (119.7 kg)  01/17/22 266 lb (120.7 kg)   BP Readings from Last 3 Encounters:  11/10/22 (!) 162/92  05/08/22 (!) 142/84  05/01/22 (!) 144/87   Pulse Readings from Last 3 Encounters:  11/10/22 93  05/08/22 89  05/01/22 86    Renal function: CrCl cannot be calculated (Patient's most recent lab result is older than the maximum 21 days allowed.).  Past Medical History:  Diagnosis Date   Diabetes mellitus without complication (HCC)    Fatty liver    Gestational diabetes    Hypertension    PCOS (polycystic ovarian syndrome)     Current Outpatient Medications on File Prior to Visit  Medication Sig Dispense Refill   aspirin EC 81 MG tablet Take 81 mg by mouth daily. Swallow whole.     labetalol (NORMODYNE) 100 MG tablet Take 400 mg by mouth once.     metFORMIN (GLUCOPHAGE-XR) 500 MG 24  hr tablet Take 1,000 mg by mouth at bedtime. 750 mg in the morning 1000 mg at night     NIFEdipine (PROCARDIA-XL/NIFEDICAL-XL) 30 MG 24 hr tablet Take 1 tablet (30 mg total) by mouth daily. 30 tablet 6   No current facility-administered medications on file prior to visit.    No Known Allergies   Assessment/Plan:  1. Hypertension -  Patient BP in room checked three times and is controlled today (123/78, 113/78, 118/72 (manual)).   Pulse elevated this morning. Has not taken her labetalol yet but she reports she typically has elevated HR. Recommended continuing to rest until echo unless approved by cardiology or OB.  Discussed what echocardiogram was testing for since patient concerned. Will follow up with patient in 3-4 weeks. Advised to continue to check BP at home and to call/message with any concerns. No med changes needed at this time.  Continue labetolol 200mg  BID Continue nifedipine 30mg  daily Recheck in 3-4 weeks  Laural Golden, PharmD, BCACP, CDCES, CPP [address] [place], [place], [address] Phone: [phone], Fax: [phone]

## 2022-11-16 DIAGNOSIS — R002 Palpitations: Secondary | ICD-10-CM

## 2022-12-01 ENCOUNTER — Ambulatory Visit: Payer: 59 | Admitting: *Deleted

## 2022-12-01 ENCOUNTER — Ambulatory Visit: Payer: 59 | Attending: Obstetrics and Gynecology

## 2022-12-01 ENCOUNTER — Other Ambulatory Visit: Payer: Self-pay | Admitting: *Deleted

## 2022-12-01 ENCOUNTER — Encounter: Payer: Self-pay | Admitting: *Deleted

## 2022-12-01 VITALS — BP 127/73 | HR 92

## 2022-12-01 DIAGNOSIS — Z363 Encounter for antenatal screening for malformations: Secondary | ICD-10-CM | POA: Insufficient documentation

## 2022-12-01 DIAGNOSIS — Z362 Encounter for other antenatal screening follow-up: Secondary | ICD-10-CM | POA: Insufficient documentation

## 2022-12-01 DIAGNOSIS — O24419 Gestational diabetes mellitus in pregnancy, unspecified control: Secondary | ICD-10-CM

## 2022-12-01 DIAGNOSIS — O162 Unspecified maternal hypertension, second trimester: Secondary | ICD-10-CM | POA: Insufficient documentation

## 2022-12-01 DIAGNOSIS — O10912 Unspecified pre-existing hypertension complicating pregnancy, second trimester: Secondary | ICD-10-CM

## 2022-12-07 ENCOUNTER — Ambulatory Visit: Payer: 59

## 2022-12-08 ENCOUNTER — Ambulatory Visit (HOSPITAL_COMMUNITY): Payer: 59 | Attending: Cardiology

## 2022-12-08 DIAGNOSIS — R0789 Other chest pain: Secondary | ICD-10-CM | POA: Insufficient documentation

## 2022-12-08 DIAGNOSIS — R0609 Other forms of dyspnea: Secondary | ICD-10-CM | POA: Diagnosis not present

## 2022-12-08 LAB — ECHOCARDIOGRAM COMPLETE
Area-P 1/2: 4.8 cm2
S' Lateral: 3.2 cm
Single Plane A4C EF: 57.4 %

## 2022-12-22 ENCOUNTER — Ambulatory Visit: Payer: 59 | Admitting: Cardiology

## 2022-12-27 ENCOUNTER — Other Ambulatory Visit: Payer: Self-pay | Admitting: *Deleted

## 2022-12-27 ENCOUNTER — Ambulatory Visit: Payer: 59 | Attending: Obstetrics and Gynecology

## 2022-12-27 ENCOUNTER — Ambulatory Visit: Payer: 59 | Admitting: *Deleted

## 2022-12-27 VITALS — BP 121/63 | HR 90

## 2022-12-27 DIAGNOSIS — E669 Obesity, unspecified: Secondary | ICD-10-CM

## 2022-12-27 DIAGNOSIS — O24419 Gestational diabetes mellitus in pregnancy, unspecified control: Secondary | ICD-10-CM | POA: Diagnosis present

## 2022-12-27 DIAGNOSIS — O10013 Pre-existing essential hypertension complicating pregnancy, third trimester: Secondary | ICD-10-CM | POA: Diagnosis not present

## 2022-12-27 DIAGNOSIS — O99212 Obesity complicating pregnancy, second trimester: Secondary | ICD-10-CM | POA: Diagnosis not present

## 2022-12-27 DIAGNOSIS — Z3A28 28 weeks gestation of pregnancy: Secondary | ICD-10-CM

## 2022-12-27 DIAGNOSIS — O09293 Supervision of pregnancy with other poor reproductive or obstetric history, third trimester: Secondary | ICD-10-CM

## 2022-12-27 DIAGNOSIS — O24415 Gestational diabetes mellitus in pregnancy, controlled by oral hypoglycemic drugs: Secondary | ICD-10-CM | POA: Diagnosis not present

## 2022-12-27 DIAGNOSIS — O99213 Obesity complicating pregnancy, third trimester: Secondary | ICD-10-CM

## 2022-12-27 DIAGNOSIS — O10912 Unspecified pre-existing hypertension complicating pregnancy, second trimester: Secondary | ICD-10-CM | POA: Insufficient documentation

## 2022-12-27 DIAGNOSIS — O34219 Maternal care for unspecified type scar from previous cesarean delivery: Secondary | ICD-10-CM

## 2023-01-26 ENCOUNTER — Ambulatory Visit: Payer: 59 | Attending: Obstetrics and Gynecology

## 2023-01-26 ENCOUNTER — Ambulatory Visit: Payer: 59 | Admitting: *Deleted

## 2023-01-26 DIAGNOSIS — O24419 Gestational diabetes mellitus in pregnancy, unspecified control: Secondary | ICD-10-CM | POA: Diagnosis present

## 2023-01-26 DIAGNOSIS — O34219 Maternal care for unspecified type scar from previous cesarean delivery: Secondary | ICD-10-CM

## 2023-01-26 DIAGNOSIS — E669 Obesity, unspecified: Secondary | ICD-10-CM

## 2023-01-26 DIAGNOSIS — O24415 Gestational diabetes mellitus in pregnancy, controlled by oral hypoglycemic drugs: Secondary | ICD-10-CM

## 2023-01-26 DIAGNOSIS — O09293 Supervision of pregnancy with other poor reproductive or obstetric history, third trimester: Secondary | ICD-10-CM

## 2023-01-26 DIAGNOSIS — O10013 Pre-existing essential hypertension complicating pregnancy, third trimester: Secondary | ICD-10-CM | POA: Diagnosis not present

## 2023-01-26 DIAGNOSIS — O99213 Obesity complicating pregnancy, third trimester: Secondary | ICD-10-CM

## 2023-01-26 DIAGNOSIS — O10912 Unspecified pre-existing hypertension complicating pregnancy, second trimester: Secondary | ICD-10-CM | POA: Diagnosis present

## 2023-01-26 DIAGNOSIS — Z3A32 32 weeks gestation of pregnancy: Secondary | ICD-10-CM

## 2023-01-28 ENCOUNTER — Other Ambulatory Visit: Payer: Self-pay

## 2023-01-28 ENCOUNTER — Encounter (HOSPITAL_COMMUNITY): Payer: Self-pay | Admitting: Obstetrics and Gynecology

## 2023-01-28 ENCOUNTER — Inpatient Hospital Stay (HOSPITAL_COMMUNITY)
Admission: AD | Admit: 2023-01-28 | Discharge: 2023-01-28 | Disposition: A | Discharge status: Home / Self Care | Payer: 59 | Attending: Obstetrics and Gynecology | Admitting: Obstetrics and Gynecology

## 2023-01-28 DIAGNOSIS — O10913 Unspecified pre-existing hypertension complicating pregnancy, third trimester: Secondary | ICD-10-CM | POA: Insufficient documentation

## 2023-01-28 DIAGNOSIS — R42 Dizziness and giddiness: Secondary | ICD-10-CM | POA: Insufficient documentation

## 2023-01-28 DIAGNOSIS — O26893 Other specified pregnancy related conditions, third trimester: Secondary | ICD-10-CM | POA: Insufficient documentation

## 2023-01-28 DIAGNOSIS — R55 Syncope and collapse: Secondary | ICD-10-CM | POA: Diagnosis not present

## 2023-01-28 DIAGNOSIS — Z3A32 32 weeks gestation of pregnancy: Secondary | ICD-10-CM | POA: Diagnosis not present

## 2023-01-28 DIAGNOSIS — O24415 Gestational diabetes mellitus in pregnancy, controlled by oral hypoglycemic drugs: Secondary | ICD-10-CM | POA: Diagnosis not present

## 2023-01-28 LAB — CBC
HCT: 32.9 % — ABNORMAL LOW (ref 36.0–46.0)
Hemoglobin: 10.5 g/dL — ABNORMAL LOW (ref 12.0–15.0)
MCH: 27.6 pg (ref 26.0–34.0)
MCHC: 31.9 g/dL (ref 30.0–36.0)
MCV: 86.4 fL (ref 80.0–100.0)
Platelets: 212 10*3/uL (ref 150–400)
RBC: 3.81 MIL/uL — ABNORMAL LOW (ref 3.87–5.11)
RDW: 13.2 % (ref 11.5–15.5)
WBC: 8 10*3/uL (ref 4.0–10.5)
nRBC: 0 % (ref 0.0–0.2)

## 2023-01-28 LAB — COMPREHENSIVE METABOLIC PANEL
ALT: 40 U/L (ref 0–44)
AST: 30 U/L (ref 15–41)
Albumin: 2.7 g/dL — ABNORMAL LOW (ref 3.5–5.0)
Alkaline Phosphatase: 82 U/L (ref 38–126)
Anion gap: 14 (ref 5–15)
BUN: 9 mg/dL (ref 6–20)
CO2: 20 mmol/L — ABNORMAL LOW (ref 22–32)
Calcium: 8.8 mg/dL — ABNORMAL LOW (ref 8.9–10.3)
Chloride: 103 mmol/L (ref 98–111)
Creatinine, Ser: 0.57 mg/dL (ref 0.44–1.00)
GFR, Estimated: >60 mL/min (ref >60)
Glucose, Bld: 91 mg/dL (ref 70–99)
Potassium: 4.2 mmol/L (ref 3.5–5.1)
Sodium: 137 mmol/L (ref 135–145)
Total Bilirubin: 0.2 mg/dL — ABNORMAL LOW (ref 0.3–1.2)
Total Protein: 6.1 g/dL — ABNORMAL LOW (ref 6.5–8.1)

## 2023-01-28 NOTE — MAU Note (Signed)
Sheryl Velasquez is a 32 y.o. at [redacted]w[redacted]d here in MAU reporting: she felt light headed and faint after church.  Reports was evaluated by EMT's and BP was elevated and blood sugar was normal.  States taking 2 meds for BP.Reports current HA, started 2 hours ago. Denies epigastric pain and visual disturbances.  Denies VB or LOF.  Endorses +FM. LMP: NA Onset of complaint: today Pain score: 4 Vitals:   01/28/23 1315  BP: 136/82  Pulse: 90  Resp: 18  Temp: 98.2 F (36.8 C)  SpO2: 97%     [id number] d/t maternal apparel Lab orders placed from triage:   UA

## 2023-01-28 NOTE — MAU Provider Note (Signed)
Chief Complaint:  Light headed, BP Evaluation, and Headache   None     HPI: Sheryl Velasquez is a 32 y.o. [id number] at [redacted]w[redacted]d with CHTN, GDM who presents to maternity admissions reporting an episode of lightheadedness at church this morning. It happened when she was standing so she found a place to sit down.  It did not get better and she remained flushed and not feeling well, so a nurse at her church was able to check her blood pressure which was 170s/90s.  She reports eating a high protein breakfast before this episode occurred. She is drinking plenty of water.  This is not the first time she had symptoms like this but today it was worse than usual. She still reports feeling dizzy when standing in MAU.  She reports good fetal movement, denies regular contractions, LOF, vaginal bleeding, vaginal itching/burning, urinary symptoms, or shortness of breath.  HPI  Past Medical History: Past Medical History:  Diagnosis Date   Benign essential hypertension, postpartum 02/25/2018   Diabetes mellitus without complication (HCC)    Diet controlled gestational diabetes mellitus (GDM) 02/18/2018   Formatting of this note might be different from the original.     1hGTT- 165  Failed 3hGTT- referred to DiabEd  Dx at 22 weeks Metformin 1000mg  po qhs     Essential hypertension 04/24/2017   Formatting of this note might be different from the original.     Patient is currently on Procardia XL 30 mg in the morning and 60 mg at night.  She has a hard time taking Procardia during the day to to fatigue  We will start methyldopa 3 times daily along with Procardia- 10/16- pt is not taking the methyldopa     Fatty liver    Gestational diabetes    Hypertension    PCOS (polycystic ovarian syndrome)     Past obstetric history: OB History  Gravida Para Term Preterm AB Living  4 2 2   1 2   SAB IAB Ectopic Multiple Live Births  1     0 2    # Outcome Date GA Lbr Len/2nd Weight Sex Type Anes PTL Lv  4 Current            3 Term 10/02/20 [redacted]w[redacted]d  3180 g M CS-LTranv Spinal  LIV  2 Term 2019     Vag-Spont   LIV  1 SAB 2018            Past Surgical History: Past Surgical History:  Procedure Laterality Date   CESAREAN SECTION N/A 10/02/2020   Procedure: CESAREAN SECTION;  Surgeon: Carlisle Cater, MD;  Location: [facility];  Service: Obstetrics;  Laterality: N/A;   CHOLECYSTECTOMY  2015   LAPAROSCOPIC UNILATERAL SALPINGO OOPHERECTOMY Right 04/20/2022   Procedure: LAPAROSCOPIC RIGHT SALPINGO-OOPHORECTOMY;  Surgeon: Carlisle Cater, MD;  Location: [facility];  Service: Gynecology;  Laterality: Right;   WISDOM TOOTH EXTRACTION  2011    Family History: Family History  Problem Relation Age of Onset   Diabetes Mother    Hypertension Mother    Hypertension Father    Hypertension Maternal Grandfather        Passed away from uncontrolled hypertension that led to multiple strokes   Anemia Paternal Grandmother    Arrhythmia Paternal Grandmother    Heart failure Paternal Grandmother        She had a failed heart vavle she had to have surgery on to fix   Anemia Paternal Aunt  Anemia Sister    Asthma Brother    Hypertension Brother     Social History: Social History   Tobacco Use   Smoking status: Never   Smokeless tobacco: Never  Vaping Use   Vaping status: Never Used  Substance Use Topics   Alcohol use: Never   Drug use: Never    Allergies: No Known Allergies  Meds:  No medications prior to admission.    ROS:  Review of Systems  Constitutional:  Positive for diaphoresis. Negative for chills, fatigue and fever.  Eyes:  Negative for visual disturbance.  Respiratory:  Negative for shortness of breath.   Cardiovascular:  Negative for chest pain.  Gastrointestinal:  Negative for abdominal pain, nausea and vomiting.  Genitourinary:  Negative for difficulty urinating, dysuria, flank pain, pelvic pain, vaginal bleeding, vaginal discharge and vaginal pain.  Neurological:   Positive for dizziness, light-headedness and headaches.  Psychiatric/Behavioral: Negative.       I have reviewed patient's Past Medical Hx, Surgical Hx, Family Hx, Social Hx, medications and allergies.   Physical Exam  Patient Vitals for the past 24 hrs:  BP Temp Temp src Pulse Resp SpO2 Height Weight  01/28/23 1500 (!) 150/93 -- -- 95 -- 99 % -- --  01/28/23 1445 130/76 -- -- 97 -- 98 % -- --  01/28/23 1430 119/72 -- -- (!) 102 -- 98 % -- --  01/28/23 1415 122/74 -- -- (!) 109 -- 99 % -- --  01/28/23 1400 (!) 148/74 -- -- 90 -- 98 % -- --  01/28/23 1353 120/79 -- -- (!) 108 -- 99 % -- --  01/28/23 1315 136/82 98.2 F (36.8 C) Oral 90 18 97 % -- --  01/28/23 1309 -- -- -- -- -- -- 5\' 2"  (1.575 m) 122.2 kg   Constitutional: Well-developed, well-nourished female in no acute distress.  HEART: normal rate, heart sounds, regular rhythm RESP: normal effort, lung sounds clear and equal bilaterally  GI: Abd soft, non-tender, gravid appropriate for gestational age.  MS: Extremities nontender, no edema, normal ROM Neurologic: Alert and oriented x 4.  GU: Neg CVAT.  PELVIC EXAM: Deferred     FHT:  Baseline 145 , moderate variability, accelerations present, no decelerations Contractions: none on toco or to palpation   Labs: Results for orders placed or performed during the hospital encounter of 01/28/23 (from the past 24 hour(s))  CBC     Status: Abnormal   Collection Time: 01/28/23  1:53 PM  Result Value Ref Range   WBC 8.0 4.0 - 10.5 K/uL   RBC 3.81 (L) 3.87 - 5.11 MIL/uL   Hemoglobin 10.5 (L) 12.0 - 15.0 g/dL   HCT 16.1 (L) 09.6 - 04.5 %   MCV 86.4 80.0 - 100.0 fL   MCH 27.6 26.0 - 34.0 pg   MCHC 31.9 30.0 - 36.0 g/dL   RDW 40.9 81.1 - 91.4 %   Platelets 212 150 - 400 K/uL   nRBC 0.0 0.0 - 0.2 %  Comprehensive metabolic panel     Status: Abnormal   Collection Time: 01/28/23  1:53 PM  Result Value Ref Range   Sodium 137 135 - 145 mmol/L   Potassium 4.2 3.5 - 5.1 mmol/L    Chloride 103 98 - 111 mmol/L   CO2 20 (L) 22 - 32 mmol/L   Glucose, Bld 91 70 - 99 mg/dL   BUN 9 6 - 20 mg/dL   Creatinine, Ser 7.82 0.44 - 1.00 mg/dL   Calcium 8.8 (  L) 8.9 - 10.3 mg/dL   Total Protein 6.1 (L) 6.5 - 8.1 g/dL   Albumin 2.7 (L) 3.5 - 5.0 g/dL   AST 30 15 - 41 U/L   ALT 40 0 - 44 U/L   Alkaline Phosphatase 82 38 - 126 U/L   Total Bilirubin 0.2 (L) 0.3 - 1.2 mg/dL   GFR, Estimated >16 >10 mL/min   Anion gap 14 5 - 15      Imaging:   MAU Course/MDM: Orders Placed This Encounter  Procedures   CBC   Comprehensive metabolic panel   ED EKG   Discharge patient   Discharge patient    No orders of the defined types were placed in this encounter.    NST reviewed and reactive Labs all wnl, BP mostly wnl, with sporadic elevated BP related to patient position/activity Pt unable to tolerate orthostatic VS, and became too dizzy with standing Likely postural hypotension related to pregnancy, no other causes for pt symptoms found Discussed with Dr Reina Fuse and encouraged pt to add some carbohydrates to meals, especially breakfast, reviewed complex carb choices as pt may be too restrictive due to her GDM Return precautions reviewed Keep scheduled appts in the office   Assessment: 1. Postural dizziness with near syncope   2. [redacted] weeks gestation of pregnancy     Plan: Discharge home Labor precautions and fetal kick counts  Follow-up Information     [facility] Follow up.   Why: As scheduled Contact information: [address] [place] [address] [phone]         [facility] Follow up.   Specialty: Obstetrics and Gynecology Contact information: [address] [place] [address] [phone]               Allergies as of 01/28/2023   No Known Allergies      Medication List     TAKE these medications    aspirin EC 81 MG tablet Take 81 mg by mouth daily. Swallow  whole.   folic acid 1 MG tablet Commonly known as: FOLVITE Take 1 mg by mouth daily.   labetalol 100 MG tablet Commonly known as: NORMODYNE Take 400 mg by mouth once.   metFORMIN 500 MG 24 hr tablet Commonly known as: GLUCOPHAGE-XR Take 1,000 mg by mouth at bedtime. 750 mg in the morning 1000 mg at night   NIFEdipine 30 MG 24 hr tablet Commonly known as: PROCARDIA-XL/NIFEDICAL-XL Take 1 tablet (30 mg total) by mouth daily.        Sharen Counter Certified Nurse-Midwife 01/28/2023 8:28 PM

## 2023-01-29 DIAGNOSIS — O24419 Gestational diabetes mellitus in pregnancy, unspecified control: Secondary | ICD-10-CM | POA: Insufficient documentation

## 2023-01-31 ENCOUNTER — Ambulatory Visit: Payer: 59

## 2023-02-07 ENCOUNTER — Ambulatory Visit: Payer: 59

## 2023-02-07 DIAGNOSIS — O24415 Gestational diabetes mellitus in pregnancy, controlled by oral hypoglycemic drugs: Secondary | ICD-10-CM

## 2023-02-14 ENCOUNTER — Ambulatory Visit: Payer: 59

## 2023-02-21 ENCOUNTER — Ambulatory Visit: Payer: 59

## 2023-02-26 ENCOUNTER — Encounter (HOSPITAL_COMMUNITY): Payer: Self-pay

## 2023-02-26 NOTE — Patient Instructions (Signed)
Sheryl Velasquez  02/26/2023   Your procedure is scheduled on:  03/12/2023  Arrive at 0800 at Entrance C on [employer] at [facility]  and [employer]. You are invited to use the FREE valet parking or use the Visitor's parking deck.  Pick up the phone at the desk and dial [phone].  Call this number if you have problems the morning of surgery: [phone]  Remember:   Do not eat food:(After Midnight) Desps de medianoche.  You may drink clear liquids until arrival at __0800___.  Clear liquids means a liquid you can see thru.  It can have color such as Cola or Kool aid.  Tea is OK and coffee as long as no milk or creamer of any kind.  Take these medicines the morning of surgery with A SIP OF WATER:  Take nifedipine and labetalol as prescribed   Do not wear jewelry, make-up or nail polish.  Do not wear lotions, powders, or perfumes. Do not wear deodorant.  Do not shave 48 hours prior to surgery.  Do not bring valuables to the hospital.  [facility] is not   responsible for any belongings or valuables brought to the hospital.  Contacts, dentures or bridgework may not be worn into surgery.  Leave suitcase in the car. After surgery it may be brought to your room.  For patients admitted to the hospital, checkout time is 11:00 AM the day of              discharge.      Please read over the following fact sheets that you were given:     Preparing for Surgery

## 2023-03-09 ENCOUNTER — Encounter (HOSPITAL_COMMUNITY)
Admission: RE | Admit: 2023-03-09 | Discharge: 2023-03-09 | Disposition: A | Discharge status: Home / Self Care | Payer: 59 | Source: Ambulatory Visit | Attending: Family Medicine | Admitting: Family Medicine

## 2023-03-09 DIAGNOSIS — Z3A00 Weeks of gestation of pregnancy not specified: Secondary | ICD-10-CM | POA: Insufficient documentation

## 2023-03-09 DIAGNOSIS — Z01812 Encounter for preprocedural laboratory examination: Secondary | ICD-10-CM | POA: Insufficient documentation

## 2023-03-09 DIAGNOSIS — O34219 Maternal care for unspecified type scar from previous cesarean delivery: Secondary | ICD-10-CM | POA: Insufficient documentation

## 2023-03-09 LAB — TYPE AND SCREEN
ABO/RH(D): A POS
Antibody Screen: NEGATIVE

## 2023-03-09 LAB — CBC
HCT: 31.1 % — ABNORMAL LOW (ref 36.0–46.0)
Hemoglobin: 9.7 g/dL — ABNORMAL LOW (ref 12.0–15.0)
MCH: 25.7 pg — ABNORMAL LOW (ref 26.0–34.0)
MCHC: 31.2 g/dL (ref 30.0–36.0)
MCV: 82.3 fL (ref 80.0–100.0)
Platelets: 201 10*3/uL (ref 150–400)
RBC: 3.78 MIL/uL — ABNORMAL LOW (ref 3.87–5.11)
RDW: 14.4 % (ref 11.5–15.5)
WBC: 5.6 10*3/uL (ref 4.0–10.5)
nRBC: 0.4 % — ABNORMAL HIGH (ref 0.0–0.2)

## 2023-03-09 LAB — RPR: RPR Ser Ql: NONREACTIVE

## 2023-03-12 ENCOUNTER — Other Ambulatory Visit: Payer: Self-pay

## 2023-03-12 ENCOUNTER — Inpatient Hospital Stay (HOSPITAL_COMMUNITY)
Admission: RE | Admit: 2023-03-12 | Discharge: 2023-03-14 | DRG: 784 | Disposition: A | Discharge status: Home / Self Care | Payer: 59 | Attending: Obstetrics and Gynecology | Admitting: Obstetrics and Gynecology

## 2023-03-12 ENCOUNTER — Encounter (HOSPITAL_COMMUNITY): Payer: Self-pay | Admitting: Obstetrics and Gynecology

## 2023-03-12 ENCOUNTER — Encounter (HOSPITAL_COMMUNITY)
Admission: RE | Disposition: A | Discharge status: Home / Self Care | Payer: Self-pay | Source: Home / Self Care | Attending: Obstetrics and Gynecology

## 2023-03-12 ENCOUNTER — Inpatient Hospital Stay (HOSPITAL_COMMUNITY): Payer: 59

## 2023-03-12 DIAGNOSIS — Z825 Family history of asthma and other chronic lower respiratory diseases: Secondary | ICD-10-CM | POA: Diagnosis not present

## 2023-03-12 DIAGNOSIS — O9962 Diseases of the digestive system complicating childbirth: Secondary | ICD-10-CM | POA: Diagnosis present

## 2023-03-12 DIAGNOSIS — O34219 Maternal care for unspecified type scar from previous cesarean delivery: Principal | ICD-10-CM

## 2023-03-12 DIAGNOSIS — O24425 Gestational diabetes mellitus in childbirth, controlled by oral hypoglycemic drugs: Secondary | ICD-10-CM | POA: Diagnosis present

## 2023-03-12 DIAGNOSIS — O34211 Maternal care for low transverse scar from previous cesarean delivery: Principal | ICD-10-CM | POA: Diagnosis present

## 2023-03-12 DIAGNOSIS — O3663X0 Maternal care for excessive fetal growth, third trimester, not applicable or unspecified: Secondary | ICD-10-CM | POA: Diagnosis present

## 2023-03-12 DIAGNOSIS — Z98891 History of uterine scar from previous surgery: Secondary | ICD-10-CM

## 2023-03-12 DIAGNOSIS — Z8249 Family history of ischemic heart disease and other diseases of the circulatory system: Secondary | ICD-10-CM

## 2023-03-12 DIAGNOSIS — Z3A39 39 weeks gestation of pregnancy: Secondary | ICD-10-CM

## 2023-03-12 DIAGNOSIS — Z833 Family history of diabetes mellitus: Secondary | ICD-10-CM

## 2023-03-12 DIAGNOSIS — K219 Gastro-esophageal reflux disease without esophagitis: Secondary | ICD-10-CM | POA: Diagnosis present

## 2023-03-12 DIAGNOSIS — Z9049 Acquired absence of other specified parts of digestive tract: Secondary | ICD-10-CM | POA: Diagnosis not present

## 2023-03-12 DIAGNOSIS — O99214 Obesity complicating childbirth: Secondary | ICD-10-CM | POA: Diagnosis present

## 2023-03-12 DIAGNOSIS — O1092 Unspecified pre-existing hypertension complicating childbirth: Secondary | ICD-10-CM | POA: Diagnosis present

## 2023-03-12 DIAGNOSIS — Z9079 Acquired absence of other genital organ(s): Secondary | ICD-10-CM | POA: Diagnosis not present

## 2023-03-12 DIAGNOSIS — Z302 Encounter for sterilization: Secondary | ICD-10-CM

## 2023-03-12 DIAGNOSIS — O403XX0 Polyhydramnios, third trimester, not applicable or unspecified: Secondary | ICD-10-CM | POA: Diagnosis present

## 2023-03-12 LAB — CREATININE, SERUM
Creatinine, Ser: 0.58 mg/dL (ref 0.44–1.00)
GFR, Estimated: >60 mL/min (ref >60)

## 2023-03-12 LAB — GLUCOSE, CAPILLARY
Glucose-Capillary: 75 mg/dL (ref 70–99)
Glucose-Capillary: 83 mg/dL (ref 70–99)
Glucose-Capillary: 85 mg/dL (ref 70–99)

## 2023-03-12 LAB — CBC
HCT: 27.3 % — ABNORMAL LOW (ref 36.0–46.0)
Hemoglobin: 8.4 g/dL — ABNORMAL LOW (ref 12.0–15.0)
MCH: 25.2 pg — ABNORMAL LOW (ref 26.0–34.0)
MCHC: 30.8 g/dL (ref 30.0–36.0)
MCV: 82 fL (ref 80.0–100.0)
Platelets: 160 10*3/uL (ref 150–400)
RBC: 3.33 MIL/uL — ABNORMAL LOW (ref 3.87–5.11)
RDW: 14.2 % (ref 11.5–15.5)
WBC: 9.2 10*3/uL (ref 4.0–10.5)
nRBC: 0 % (ref 0.0–0.2)

## 2023-03-12 SURGERY — Surgical Case
Anesthesia: Spinal | Laterality: Bilateral

## 2023-03-12 MED ORDER — DEXTROSE 50 % IV SOLN
INTRAVENOUS | Status: AC
  Filled 2023-03-12: qty 50

## 2023-03-12 MED ORDER — CHLORHEXIDINE GLUCONATE 0.12 % MT SOLN
15.0000 mL | Freq: Once | OROMUCOSAL | Status: AC
  Administered 2023-03-12: 15 mL via OROMUCOSAL

## 2023-03-12 MED ORDER — ONDANSETRON HCL 4 MG/2ML IJ SOLN
INTRAMUSCULAR | Status: AC
  Filled 2023-03-12: qty 2

## 2023-03-12 MED ORDER — PHENYLEPHRINE HCL (PRESSORS) 10 MG/ML IV SOLN
INTRAVENOUS | Status: DC | PRN
  Administered 2023-03-12 (×2): 80 ug via INTRAVENOUS
  Administered 2023-03-12: 160 ug via INTRAVENOUS
  Administered 2023-03-12: 80 ug via INTRAVENOUS

## 2023-03-12 MED ORDER — TRANEXAMIC ACID-NACL 1000-0.7 MG/100ML-% IV SOLN
1000.0000 mg | Freq: Once | INTRAVENOUS | Status: AC
  Administered 2023-03-12: 1000 mg via INTRAVENOUS

## 2023-03-12 MED ORDER — SCOPOLAMINE 1 MG/3DAYS TD PT72: 1.0000 | MEDICATED_PATCH | Freq: Once | TRANSDERMAL | Status: DC

## 2023-03-12 MED ORDER — CEFAZOLIN SODIUM-DEXTROSE 2-4 GM/100ML-% IV SOLN
INTRAVENOUS | Status: AC
  Filled 2023-03-12: qty 100

## 2023-03-12 MED ORDER — SODIUM CHLORIDE 0.9 % IR SOLN
Status: DC | PRN
  Administered 2023-03-12: 1

## 2023-03-12 MED ORDER — DIPHENHYDRAMINE HCL 50 MG/ML IJ SOLN
12.5000 mg | INTRAMUSCULAR | Status: DC | PRN
Start: 2023-03-12 — End: 2023-03-12

## 2023-03-12 MED ORDER — ENOXAPARIN SODIUM 60 MG/0.6ML IJ SOSY
60.0000 mg | PREFILLED_SYRINGE | INTRAMUSCULAR | Status: DC
  Administered 2023-03-13 – 2023-03-14 (×2): 60 mg via SUBCUTANEOUS
  Filled 2023-03-12 (×2): qty 0.6

## 2023-03-12 MED ORDER — NIFEDIPINE ER OSMOTIC RELEASE 30 MG PO TB24
30.0000 mg | ORAL_TABLET | Freq: Every day | ORAL | Status: DC
  Administered 2023-03-12 – 2023-03-14 (×3): 30 mg via ORAL
  Filled 2023-03-12 (×3): qty 1

## 2023-03-12 MED ORDER — LABETALOL HCL 100 MG PO TABS
100.0000 mg | ORAL_TABLET | Freq: Two times a day (BID) | ORAL | Status: DC
  Filled 2023-03-12: qty 1

## 2023-03-12 MED ORDER — AMISULPRIDE (ANTIEMETIC) 5 MG/2ML IV SOLN: 10.0000 mg | Freq: Once | INTRAVENOUS | Status: DC | PRN

## 2023-03-12 MED ORDER — LACTATED RINGERS IV SOLN: INTRAVENOUS | Status: DC

## 2023-03-12 MED ORDER — FAMOTIDINE 20 MG PO TABS
ORAL_TABLET | ORAL | Status: AC
  Filled 2023-03-12: qty 1

## 2023-03-12 MED ORDER — MORPHINE SULFATE (PF) 0.5 MG/ML IJ SOLN
INTRAMUSCULAR | Status: DC | PRN
  Administered 2023-03-12: .15 mg via INTRATHECAL

## 2023-03-12 MED ORDER — IBUPROFEN 600 MG PO TABS
600.0000 mg | ORAL_TABLET | Freq: Four times a day (QID) | ORAL | Status: DC
  Administered 2023-03-13 – 2023-03-14 (×5): 600 mg via ORAL
  Filled 2023-03-12 (×5): qty 1

## 2023-03-12 MED ORDER — OXYTOCIN-SODIUM CHLORIDE 30-0.9 UT/500ML-% IV SOLN
INTRAVENOUS | Status: AC
  Filled 2023-03-12: qty 500

## 2023-03-12 MED ORDER — VITAMIN K1 1 MG/0.5ML IJ SOLN
INTRAMUSCULAR | Status: AC
  Filled 2023-03-12: qty 0.5

## 2023-03-12 MED ORDER — ACETAMINOPHEN 500 MG PO TABS: 1000.0000 mg | ORAL_TABLET | Freq: Four times a day (QID) | ORAL | Status: DC

## 2023-03-12 MED ORDER — NALOXONE HCL 0.4 MG/ML IJ SOLN: 0.4000 mg | INTRAMUSCULAR | Status: DC | PRN

## 2023-03-12 MED ORDER — ERYTHROMYCIN 5 MG/GM OP OINT
TOPICAL_OINTMENT | OPHTHALMIC | Status: AC
  Filled 2023-03-12: qty 1

## 2023-03-12 MED ORDER — DIPHENHYDRAMINE HCL 25 MG PO CAPS: 25.0000 mg | ORAL_CAPSULE | Freq: Four times a day (QID) | ORAL | Status: DC | PRN

## 2023-03-12 MED ORDER — SOD CITRATE-CITRIC ACID 500-334 MG/5ML PO SOLN
ORAL | Status: AC
  Filled 2023-03-12: qty 30

## 2023-03-12 MED ORDER — MENTHOL 3 MG MT LOZG: 1.0000 | LOZENGE | OROMUCOSAL | Status: DC | PRN

## 2023-03-12 MED ORDER — PHENYLEPHRINE HCL-NACL 20-0.9 MG/250ML-% IV SOLN
INTRAVENOUS | Status: DC | PRN
  Administered 2023-03-12: 75 ug/min via INTRAVENOUS
  Administered 2023-03-12: 60 ug/min via INTRAVENOUS

## 2023-03-12 MED ORDER — SCOPOLAMINE 1 MG/3DAYS TD PT72
1.0000 | MEDICATED_PATCH | Freq: Once | TRANSDERMAL | Status: DC
  Administered 2023-03-12: 1.5 mg via TRANSDERMAL

## 2023-03-12 MED ORDER — SOD CITRATE-CITRIC ACID 500-334 MG/5ML PO SOLN
30.0000 mL | Freq: Once | ORAL | Status: AC
  Administered 2023-03-12: 30 mL via ORAL

## 2023-03-12 MED ORDER — STERILE WATER FOR IRRIGATION IR SOLN
Status: DC | PRN
  Administered 2023-03-12: 1000 mL

## 2023-03-12 MED ORDER — FENTANYL CITRATE (PF) 100 MCG/2ML IJ SOLN
25.0000 ug | INTRAMUSCULAR | Status: DC | PRN
Start: 2023-03-12 — End: 2023-03-12

## 2023-03-12 MED ORDER — SIMETHICONE 80 MG PO CHEW
80.0000 mg | CHEWABLE_TABLET | Freq: Three times a day (TID) | ORAL | Status: DC
  Administered 2023-03-12 – 2023-03-14 (×6): 80 mg via ORAL
  Filled 2023-03-12 (×6): qty 1

## 2023-03-12 MED ORDER — MEPERIDINE HCL 25 MG/ML IJ SOLN: 6.2500 mg | INTRAMUSCULAR | Status: DC | PRN

## 2023-03-12 MED ORDER — OXYCODONE HCL 5 MG PO TABS
5.0000 mg | ORAL_TABLET | ORAL | Status: DC | PRN
  Administered 2023-03-14 (×2): 5 mg via ORAL
  Filled 2023-03-12 (×2): qty 1

## 2023-03-12 MED ORDER — AMISULPRIDE (ANTIEMETIC) 5 MG/2ML IV SOLN
10.0000 mg | Freq: Once | INTRAVENOUS | Status: AC
  Administered 2023-03-12: 10 mg via INTRAVENOUS
  Filled 2023-03-12: qty 4

## 2023-03-12 MED ORDER — ONDANSETRON HCL 4 MG/2ML IJ SOLN
INTRAMUSCULAR | Status: DC | PRN
  Administered 2023-03-12: 4 mg via INTRAVENOUS

## 2023-03-12 MED ORDER — TRANEXAMIC ACID-NACL 1000-0.7 MG/100ML-% IV SOLN
INTRAVENOUS | Status: AC
  Filled 2023-03-12: qty 100

## 2023-03-12 MED ORDER — ACETAMINOPHEN 500 MG PO TABS: 1000.0000 mg | ORAL_TABLET | ORAL | Status: DC

## 2023-03-12 MED ORDER — ACETAMINOPHEN 10 MG/ML IV SOLN
INTRAVENOUS | Status: DC | PRN
  Administered 2023-03-12: 1000 mg via INTRAVENOUS

## 2023-03-12 MED ORDER — SODIUM CHLORIDE 0.9% FLUSH: 3.0000 mL | INTRAVENOUS | Status: DC | PRN

## 2023-03-12 MED ORDER — COCONUT OIL OIL: 1.0000 | TOPICAL_OIL | Status: DC | PRN

## 2023-03-12 MED ORDER — BUPIVACAINE IN DEXTROSE 0.75-8.25 % IT SOLN
INTRATHECAL | Status: DC | PRN
  Administered 2023-03-12: 1.6 mL via INTRATHECAL

## 2023-03-12 MED ORDER — SENNOSIDES-DOCUSATE SODIUM 8.6-50 MG PO TABS
2.0000 | ORAL_TABLET | Freq: Every day | ORAL | Status: DC
  Administered 2023-03-13 – 2023-03-14 (×2): 2 via ORAL
  Filled 2023-03-12 (×2): qty 2

## 2023-03-12 MED ORDER — FENTANYL CITRATE (PF) 100 MCG/2ML IJ SOLN
INTRAMUSCULAR | Status: DC | PRN
  Administered 2023-03-12: 15 ug via INTRATHECAL

## 2023-03-12 MED ORDER — FENTANYL CITRATE (PF) 100 MCG/2ML IJ SOLN
INTRAMUSCULAR | Status: AC
  Filled 2023-03-12: qty 2

## 2023-03-12 MED ORDER — POVIDONE-IODINE 10 % EX SWAB
2.0000 | Freq: Once | CUTANEOUS | Status: AC
  Administered 2023-03-12: 2 via TOPICAL

## 2023-03-12 MED ORDER — KETOROLAC TROMETHAMINE 30 MG/ML IJ SOLN
30.0000 mg | Freq: Four times a day (QID) | INTRAMUSCULAR | Status: AC
  Administered 2023-03-12 – 2023-03-13 (×3): 30 mg via INTRAVENOUS
  Filled 2023-03-12 (×3): qty 1

## 2023-03-12 MED ORDER — DIPHENHYDRAMINE HCL 25 MG PO CAPS
25.0000 mg | ORAL_CAPSULE | ORAL | Status: DC | PRN
Start: 2023-03-12 — End: 2023-03-12

## 2023-03-12 MED ORDER — CHLORHEXIDINE GLUCONATE 0.12 % MT SOLN
OROMUCOSAL | Status: AC
  Filled 2023-03-12: qty 15

## 2023-03-12 MED ORDER — SCOPOLAMINE 1 MG/3DAYS TD PT72
MEDICATED_PATCH | TRANSDERMAL | Status: AC
  Filled 2023-03-12: qty 1

## 2023-03-12 MED ORDER — ACETAMINOPHEN 10 MG/ML IV SOLN
INTRAVENOUS | Status: AC
  Filled 2023-03-12: qty 100

## 2023-03-12 MED ORDER — KETOROLAC TROMETHAMINE 30 MG/ML IJ SOLN
30.0000 mg | Freq: Four times a day (QID) | INTRAMUSCULAR | Status: DC | PRN
Start: 2023-03-12 — End: 2023-03-12

## 2023-03-12 MED ORDER — WITCH HAZEL-GLYCERIN EX PADS: 1.0000 | MEDICATED_PAD | CUTANEOUS | Status: DC | PRN

## 2023-03-12 MED ORDER — ZOLPIDEM TARTRATE 5 MG PO TABS: 5.0000 mg | ORAL_TABLET | Freq: Every evening | ORAL | Status: DC | PRN

## 2023-03-12 MED ORDER — OXYTOCIN-SODIUM CHLORIDE 30-0.9 UT/500ML-% IV SOLN
INTRAVENOUS | Status: DC | PRN
  Administered 2023-03-12: 300 mL via INTRAVENOUS

## 2023-03-12 MED ORDER — ACETAMINOPHEN 325 MG PO TABS
650.0000 mg | ORAL_TABLET | ORAL | Status: DC
  Administered 2023-03-12 – 2023-03-14 (×10): 650 mg via ORAL
  Filled 2023-03-12 (×10): qty 2

## 2023-03-12 MED ORDER — MORPHINE SULFATE (PF) 0.5 MG/ML IJ SOLN
INTRAMUSCULAR | Status: AC
  Filled 2023-03-12: qty 10

## 2023-03-12 MED ORDER — LACTATED RINGERS IV SOLN
INTRAVENOUS | Status: DC
  Administered 2023-03-12: 125 mL via INTRAVENOUS

## 2023-03-12 MED ORDER — DEXTROSE 50 % IV SOLN
12.5000 g | INTRAVENOUS | Status: AC
  Administered 2023-03-12: 12.5 g via INTRAVENOUS

## 2023-03-12 MED ORDER — OXYTOCIN-SODIUM CHLORIDE 30-0.9 UT/500ML-% IV SOLN
2.5000 [IU]/h | INTRAVENOUS | Status: AC
  Administered 2023-03-12: 2.5 [IU]/h via INTRAVENOUS
  Filled 2023-03-12: qty 500

## 2023-03-12 MED ORDER — CEFAZOLIN IN SODIUM CHLORIDE 3-0.9 GM/100ML-% IV SOLN
3.0000 g | INTRAVENOUS | Status: AC
  Administered 2023-03-12: 3 g via INTRAVENOUS

## 2023-03-12 MED ORDER — PHENYLEPHRINE HCL-NACL 20-0.9 MG/250ML-% IV SOLN
INTRAVENOUS | Status: AC
  Filled 2023-03-12: qty 250

## 2023-03-12 MED ORDER — FAMOTIDINE 20 MG PO TABS
20.0000 mg | ORAL_TABLET | Freq: Once | ORAL | Status: AC
  Administered 2023-03-12: 20 mg via ORAL

## 2023-03-12 MED ORDER — ONDANSETRON HCL 4 MG/2ML IJ SOLN: 4.0000 mg | Freq: Three times a day (TID) | INTRAMUSCULAR | Status: DC | PRN

## 2023-03-12 MED ORDER — KETOROLAC TROMETHAMINE 30 MG/ML IJ SOLN: 30.0000 mg | Freq: Four times a day (QID) | INTRAMUSCULAR | Status: DC | PRN

## 2023-03-12 MED ORDER — SIMETHICONE 80 MG PO CHEW
80.0000 mg | CHEWABLE_TABLET | ORAL | Status: DC | PRN
Start: 2023-03-12 — End: 2023-03-14

## 2023-03-12 MED ORDER — ORAL CARE MOUTH RINSE: 15.0000 mL | Freq: Once | OROMUCOSAL | Status: AC

## 2023-03-12 MED ORDER — ONDANSETRON HCL 4 MG/2ML IJ SOLN
4.0000 mg | Freq: Once | INTRAMUSCULAR | Status: DC | PRN
Start: 2023-03-12 — End: 2023-03-12

## 2023-03-12 MED ORDER — PRENATAL MULTIVITAMIN CH
1.0000 | ORAL_TABLET | Freq: Every day | ORAL | Status: DC
  Administered 2023-03-13 – 2023-03-14 (×2): 1 via ORAL
  Filled 2023-03-12 (×2): qty 1

## 2023-03-12 MED ORDER — DIBUCAINE (PERIANAL) 1 % EX OINT: 1.0000 | TOPICAL_OINTMENT | CUTANEOUS | Status: DC | PRN

## 2023-03-12 MED ORDER — NALOXONE HCL 4 MG/10ML IJ SOLN: 1.0000 ug/kg/h | INTRAVENOUS | Status: DC | PRN

## 2023-03-12 SURGICAL SUPPLY — 39 items
CHLORAPREP W/TINT 26ML (MISCELLANEOUS) ×2 IMPLANT
CLAMP UMBILICAL CORD (MISCELLANEOUS) ×1 IMPLANT
CLOTH BEACON ORANGE TIMEOUT ST (SAFETY) ×1 IMPLANT
DERMABOND ADVANCED .7 DNX12 (GAUZE/BANDAGES/DRESSINGS) ×1 IMPLANT
DRSG OPSITE POSTOP 4X10 (GAUZE/BANDAGES/DRESSINGS) ×1 IMPLANT
ELECT REM PT RETURN 9FT ADLT (ELECTROSURGICAL) ×1
ELECTRODE REM PT RTRN 9FT ADLT (ELECTROSURGICAL) ×1 IMPLANT
EXTRACTOR VACUUM BELL CUP MITY (SUCTIONS) IMPLANT
GAUZE SPONGE 4X4 12PLY STRL LF (GAUZE/BANDAGES/DRESSINGS) IMPLANT
GLOVE BIO SURGEON STRL SZ 6.5 (GLOVE) ×1 IMPLANT
GLOVE BIOGEL PI IND STRL 6.5 (GLOVE) ×1 IMPLANT
GLOVE BIOGEL PI IND STRL 7.0 (GLOVE) ×2 IMPLANT
GOWN STRL REUS W/TWL LRG LVL3 (GOWN DISPOSABLE) ×2 IMPLANT
KIT ABG SYR 3ML LUER SLIP (SYRINGE) IMPLANT
LIGASURE IMPACT 36 18CM CVD LR (INSTRUMENTS) IMPLANT
MAT PREVALON FULL STRYKER (MISCELLANEOUS) IMPLANT
NDL HYPO 25X5/8 SAFETYGLIDE (NEEDLE) IMPLANT
NEEDLE HYPO 25X5/8 SAFETYGLIDE (NEEDLE) IMPLANT
NS IRRIG 1000ML POUR BTL (IV SOLUTION) ×1 IMPLANT
PACK C SECTION WH (CUSTOM PROCEDURE TRAY) ×1 IMPLANT
PAD ABD 8X10 STRL (GAUZE/BANDAGES/DRESSINGS) IMPLANT
PAD OB MATERNITY 4.3X12.25 (PERSONAL CARE ITEMS) ×1 IMPLANT
PENCIL SMOKE EVAC W/HOLSTER (ELECTROSURGICAL) ×1 IMPLANT
RETAINER VISCERAL (MISCELLANEOUS) IMPLANT
RETRACTOR TRAXI PANNICULUS (MISCELLANEOUS) IMPLANT
RTRCTR C-SECT PINK 25CM LRG (MISCELLANEOUS) IMPLANT
SUT PDS AB 0 CTX 36 PDP370T (SUTURE) IMPLANT
SUT PLAIN 2 0 (SUTURE)
SUT PLAIN ABS 2-0 CT1 27XMFL (SUTURE) IMPLANT
SUT VIC AB 0 CT1 36 (SUTURE) ×2 IMPLANT
SUT VIC AB 2-0 CT1 27 (SUTURE) ×1
SUT VIC AB 2-0 CT1 TAPERPNT 27 (SUTURE) ×1 IMPLANT
SUT VIC AB 3-0 SH 27 (SUTURE)
SUT VIC AB 3-0 SH 27X BRD (SUTURE) IMPLANT
SUT VIC AB 4-0 KS 27 (SUTURE) ×1 IMPLANT
SUTURE PLAIN GUT 2.0 ETHICON (SUTURE) IMPLANT
TOWEL OR 17X24 6PK STRL BLUE (TOWEL DISPOSABLE) ×1 IMPLANT
TRAY FOLEY W/BAG SLVR 14FR LF (SET/KITS/TRAYS/PACK) ×1 IMPLANT
WATER STERILE IRR 1000ML POUR (IV SOLUTION) ×1 IMPLANT

## 2023-03-12 NOTE — Transfer of Care (Signed)
Immediate Anesthesia Transfer of Care Note  Patient: Sheryl Velasquez  Procedure(s) Performed: CESAREAN SECTION WITH BILATERAL TUBAL LIGATION (Bilateral)  Patient Location: PACU  Anesthesia Type:Spinal  Level of Consciousness: awake, alert , and oriented  Airway & Oxygen Therapy: Patient Spontanous Breathing  Post-op Assessment: Report given to RN and Post -op Vital signs reviewed and stable  Post vital signs: Reviewed and stable  Last Vitals:  Vitals Value Taken Time  BP 140/77   Temp    Pulse 72   Resp 20   SpO2 97     Last Pain:  Vitals:   03/12/23 0908  TempSrc: Oral      Patients Stated Pain Goal: 4 (03/12/23 0908)  Complications: No notable events documented.

## 2023-03-12 NOTE — Anesthesia Preprocedure Evaluation (Signed)
Anesthesia Evaluation  Patient identified by MRN, date of birth, ID band Patient awake    Reviewed: Allergy & Precautions, NPO status , Patient's Chart, lab work & pertinent test results, reviewed documented beta blocker date and time   Airway Mallampati: III  TM Distance: >3 FB Neck ROM: Full    Dental  (+) Teeth Intact, Dental Advisory Given   Pulmonary neg pulmonary ROS   Pulmonary exam normal breath sounds clear to auscultation       Cardiovascular hypertension, Pt. on medications and Pt. on home beta blockers Normal cardiovascular exam Rhythm:Regular Rate:Normal     Neuro/Psych  PSYCHIATRIC DISORDERS Anxiety Depression    negative neurological ROS     GI/Hepatic Neg liver ROS,GERD  Medicated,,  Endo/Other  diabetes, Type 2, Oral Hypoglycemic Agents  BMI 50.5 PCOS  Renal/GU negative Renal ROS     Musculoskeletal negative musculoskeletal ROS (+)    Abdominal   Peds  Hematology negative hematology ROS (+) Plt 201k   Anesthesia Other Findings Day of surgery medications reviewed with the patient.  Reproductive/Obstetrics                             Anesthesia Physical Anesthesia Plan  ASA: 4  Anesthesia Plan: Spinal   Post-op Pain Management:    Induction: Intravenous  PONV Risk Score and Plan: 2 and Treatment may vary due to age or medical condition, Scopolamine patch - Pre-op, Dexamethasone and Ondansetron  Airway Management Planned: Natural Airway  Additional Equipment:   Intra-op Plan:   Post-operative Plan:   Informed Consent: I have reviewed the patients History and Physical, chart, labs and discussed the procedure including the risks, benefits and alternatives for the proposed anesthesia with the patient or authorized representative who has indicated his/her understanding and acceptance.     Dental advisory given  Plan Discussed with: CRNA, Anesthesiologist and  Surgeon  Anesthesia Plan Comments: (Discussed risks and benefits of and differences between spinal and general. Discussed risks of spinal including headache, backache, failure, bleeding, infection, and nerve damage. Patient consents to spinal. Questions answered. Coagulation studies and platelet count acceptable.)       Anesthesia Quick Evaluation

## 2023-03-12 NOTE — Lactation Note (Signed)
This note was copied from a baby's chart. Lactation Consultation Note  Patient Name: Sheryl Velasquez Date: 03/12/2023 Age:31 hours Reason for consult: Initial assessment;Term;Maternal endocrine disorder  P3- MOB reports that infant is nursing well and denies having any pain or pinching while he nurses. MOB has a hx of an oversupply and milk blebs. MOB requested to be given a hand pump tomorrow at some point. MOB denies having any questions or concerns at this time.  LC reviewed feeding infant on cue 8-12x in 24 hrs, not allowing infant to go over 3 hrs without a feeding, CDC milk storage guidelines and LC services handout. LC encouraged MOB to call for further assistance as needed.  Maternal Data Does the patient have breastfeeding experience prior to this delivery?: Yes How long did the patient breastfeed?: 6 months with first child and over 1 year with second child.  Feeding Mother's Current Feeding Choice: Breast Milk  Lactation Tools Discussed/Used Pump Education: Milk Storage  Interventions Interventions: Breast feeding basics reviewed;Education;LC Services brochure  Discharge Discharge Education: Warning signs for feeding baby Pump: DEBP;Personal;Hands Free  Consult Status Consult Status: Follow-up Date: 03/13/23 Follow-up type: In-patient    Dema Severin BS, IBCLC 03/12/2023, 8:27 PM

## 2023-03-12 NOTE — Op Note (Signed)
C-Section Operative Note  Date: 03/12/23  Preoperative Diagnosis: IUP @ 39 0/8, history of RSO Postoperative Diagnosis: Same as above Procedure: VA-ERLTCS plus left salpingectomy Surgeon: Ellison Hughs, MD Assist: CST An experienced assistant was required given the standard of surgical care given the complexity of the case and maternal body habitus.  This assistant was needed for exposure, dissection, suctioning, retraction, instrument exchange, assisting with delivery with administration of fundal pressure, and for overall help during the procedure. Initially, OB fellow was present Wylene Simmer, MD) but was called to deliver. Procedure required extra time due to patient habitus and scar formation.   Findings: Viable female infant weighing 8lb12oz with APGARS of 9 and 9 at 1 and 5 minutes, respectively. Normal appearing uterus, left fallopian tubes and ovaries. Surgically absent right fallopian tube and ovary.  Specimens: placenta and left fallopian tube to pathology EBL 1008 IVF 2300 UOP 200  Consent:  R/B/A of cesarean section discussed with patient. Alternative would be vaginal delivery which would mean shorter postpartum stay and decreased risk of bleeding. Risks of section include infection of the uterus, pelvic organs, or skin, inadvertent injury to internal organs, such as bowel or bladder. If there is major injury, extensive surgery may be required. If injury is minor, it may be treated with relative ease. Discussed possibility of excessive blood loss and transfusion. If bleeding cannot be controlled using medical or minor surgical methods, a cesarean hysterectomy may be performed which would mean no future fertility. Patient accepts the possibility of blood transfusion, if necessary. Patient understands and agrees to move forward with section.   Operative Procedure: Patient was taken to the operating room where epidural anesthesia was found to be adequate by Allis clamp test. She was  prepped and draped in the normal sterile fashion in the dorsal supine position with a leftward tilt. 3g Ancef given from BMI. TRAXI retractor placed on abdomen for appropriate visualization. An appropriate time out was performed. A Pfannenstiel skin incision was then made with the scalpel and carried through to the underlying layer of fascia by sharp dissection and Bovie cautery. The fascia was nicked in the midline and the incision was extended laterally with Mayo scissors. The superior aspect of the incision was grasped Kocher clamps and dissected off the underlying rectus muscles. In a similar fashion the inferior aspect was dissected off the rectus muscles. Rectus muscles were separated in the midline and the peritoneal cavity entered bluntly. The peritoneal incision was then extended both superiorly and inferiorly with careful attention to avoid both bowel and bladder. The Alexis self-retaining wound retractor was then placed within the incision and the lower uterine segment exposed. The bladder flap was developed with Metzenbaum scissors and pushed away from the lower uterine segment. The lower uterine segment was then incised in a low transverse fashion and the cavity itself entered bluntly. The incision was extended bluntly. Amniotic sac was ruptured and fluid was noted to be clear in color. Attempted to deliver fetal occiput in standard maneuver however LGA infant suspected. First, rectus bellies were cut (extremely tight from scarring) as well as skin incision extended. Fetal vertex still did not easily deliver, therefore vacuum was called for as well as second hand. Hysterotomy was extended 1cm at bilateral apices. Infant then delivered with aid of a Mity-Vac with one pull within green zone. Spontaneous cry and good tone noted. The infant was handed off to NICU. The placenta was then spontaneously expressed from the uterus and the uterus cleared of all clots  and debris with moist lap sponge. The uterine  incision was then repaired in 2 layers the first layer was a running locked layer of 0-vicryl and the second an imbricating layer of the same suture. The tubes and ovaries were inspected and the gutters cleared of all clots and debris. The uterine incision was inspected and found to be hemostatic. Attention turned to left adnexa with salpingectomy was carried out with Ligasure device with excellent hemostasis.   All instruments and sponges as well as the Alexis retractor were then removed from the abdomen. The rectus muscles were then reapproximated with several interrupted mattress sutures of 2-0 plain gut. The fascia was then closed with 0 PDS in a running fashion beginning at bilateral apices and meeting in middle. Subcutaneous tissue was reapproximated with 3-0 plain in a running fashion in two layers. The skin was closed with a subcuticular stitch of 4-0 Vicryl on a Keith needle and then reinforced with Dermabond and a Honeycomb. At the conclusion of the procedure all instruments and sponge counts were correct. Patient was taken to the recovery room in good condition with her baby accompanying her skin to skin.   Given EBL, repeat CBC this evening at 1800. Patient desires circumcision for son

## 2023-03-12 NOTE — Anesthesia Procedure Notes (Signed)
Spinal  Patient location during procedure: OR Start time: 03/12/2023 11:14 AM End time: 03/12/2023 11:17 AM Reason for block: surgical anesthesia Staffing Performed: anesthesiologist  Anesthesiologist: Collene Schlichter, MD Performed by: Collene Schlichter, MD Authorized by: Collene Schlichter, MD   Preanesthetic Checklist Completed: patient identified, IV checked, risks and benefits discussed, surgical consent, monitors and equipment checked, pre-op evaluation and timeout performed Spinal Block Patient position: sitting Prep: DuraPrep and site prepped and draped Patient monitoring: continuous pulse ox and blood pressure Approach: midline Location: L3-4 Injection technique: single-shot Needle Needle type: Pencan  Needle gauge: 24 G Assessment Events: CSF return Additional Notes Functioning IV was confirmed and monitors were applied. Sterile prep and drape, including hand hygiene, mask and sterile gloves were used. The patient was positioned and the spine was prepped. The skin was anesthetized with lidocaine.  Free flow of clear CSF was obtained prior to injecting local anesthetic into the CSF.  The spinal needle aspirated freely following injection.  The needle was carefully withdrawn.  The patient tolerated the procedure well. Consent was obtained prior to procedure with all questions answered and concerns addressed. Risks including but not limited to bleeding, infection, nerve damage, paralysis, failed block, inadequate analgesia, allergic reaction, high spinal, itching and headache were discussed and the patient wished to proceed.   [name], MD

## 2023-03-12 NOTE — H&P (Signed)
Sheryl Velasquez is a 32 y.o. female presenting for scheduled section. +FM, denies VB, LOF, some irr ctx.  PNC c/b 1) H/o csx x1, desires ERLTCS with satisfied fertility - s/p lap RSO for symptomatic enlarged right ovary, only left fallopian tube remains 2) CHTN on Rx - controlled on Procardia 30XL every day and Labetalol 200mg  BID, baby ASA  3) GDMA2 with h/o PCS - conceived on PO metformin, currently on 750mg  AM, 1000mg  PM, A1c 5.4 @ workup    *Failed early 1hr at 24 (h/o GDM), began BG checks in 2nd trim 4) Maternal obesity - BMI 50 @ workup, monthly GS with weekly BPP at 32wks given comorbidities 5) Polyphydramnios - @ 38wks, AFI 26cm  GS at 36 2/7: 3869g/8lb8oz/97th%tile. Incidental breech presentation noted on BPP on 03/06/23 (BPP 8/8 otherwise).  GBS neg  Of note, preoperative labs significant for 9.7/31.1/201K.   OB History     Gravida  4   Para  2   Term  2   Preterm      AB  1   Living  2      SAB  1   IAB      Ectopic      Multiple  0   Live Births  2          Past Medical History:  Diagnosis Date   Benign essential hypertension, postpartum 02/25/2018   Diabetes mellitus without complication (HCC)    Diet controlled gestational diabetes mellitus (GDM) 02/18/2018   Formatting of this note might be different from the original.     1hGTT- 165  Failed 3hGTT- referred to [facility]  Dx at 22 weeks Metformin 1000mg  po qhs     Essential hypertension 04/24/2017   Formatting of this note might be different from the original.     Patient is currently on Procardia XL 30 mg in the morning and 60 mg at night.  She has a hard time taking Procardia during the day to to fatigue  We will start methyldopa 3 times daily along with Procardia- 10/16- pt is not taking the methyldopa     Fatty liver    Gestational diabetes    Hypertension    PCOS (polycystic ovarian syndrome)    Past Surgical History:  Procedure Laterality Date   CESAREAN SECTION N/A 10/02/2020    Procedure: CESAREAN SECTION;  Surgeon: Carlisle Cater, MD;  Location: [facility];  Service: Obstetrics;  Laterality: N/A;   CHOLECYSTECTOMY  2015   LAPAROSCOPIC UNILATERAL SALPINGO OOPHERECTOMY Right 04/20/2022   Procedure: LAPAROSCOPIC RIGHT SALPINGO-OOPHORECTOMY;  Surgeon: Carlisle Cater, MD;  Location: [facility];  Service: Gynecology;  Laterality: Right;   WISDOM TOOTH EXTRACTION  2011   Family History: family history includes Anemia in her paternal aunt, paternal grandmother, and sister; Arrhythmia in her paternal grandmother; Asthma in her brother; Diabetes in her mother; Heart failure in her paternal grandmother; Hypertension in her brother, father, maternal grandfather, and mother. Social History:  reports that she has never smoked. She has never used smokeless tobacco. She reports that she does not drink alcohol and does not use drugs.     Maternal Diabetes: Yes:  Diabetes Type:  Insulin/Medication controlled Genetic Screening: Declined Maternal Ultrasounds/Referrals: Normal Fetal Ultrasounds or other Referrals:  Referred to Materal Fetal Medicine  Maternal Substance Abuse:  No Significant Maternal Medications:  Meds include: Other: Procardia, Labetalol, Metformin Significant Maternal Lab Results:  Group B Strep negative Number of Prenatal Visits:greater than 3  verified prenatal visits Maternal Vaccinations:TDap Declined flu, COVID, RSV Other Comments:  None  Review of Systems  Constitutional:  Negative for chills and fever.  Respiratory:  Negative for shortness of breath.   Cardiovascular:  Negative for chest pain, palpitations and leg swelling.  Gastrointestinal:  Negative for abdominal pain and vomiting.  Neurological:  Negative for dizziness, weakness and headaches.  Psychiatric/Behavioral:  Negative for suicidal ideas.    Maternal Medical History:  Contractions: Frequency: rare.   Fetal activity: Perceived fetal activity is normal.   Prenatal  complications: PIH and polyhydramnios.   No bleeding, IUGR, preterm labor or thrombocytopenia.   Prenatal Complications - Diabetes: gestational. Diabetes is managed by oral agent (monotherapy).       currently breastfeeding. Exam Physical Exam Constitutional:      General: She is not in acute distress.    Appearance: She is well-developed.  HENT:     Head: Normocephalic and atraumatic.  Eyes:     Pupils: Pupils are equal, round, and reactive to light.  Cardiovascular:     Rate and Rhythm: Normal rate and regular rhythm.     Heart sounds: No murmur heard.    No gallop.  Abdominal:     Tenderness: There is no abdominal tenderness. There is no guarding or rebound.  Genitourinary:    Vagina: Normal.     Uterus: Normal.   Musculoskeletal:        General: Normal range of motion.     Cervical back: Normal range of motion and neck supple.  Skin:    General: Skin is warm and dry.  Neurological:     Mental Status: She is alert and oriented to person, place, and time.     Prenatal labs: ABO, Rh: --/--/A POS (11/15 1005) Antibody: NEG (11/15 1005) Rubella: Immune (04/23 0000) RPR: NON REACTIVE (11/15 1030)  HBsAg: Negative (04/23 0000)  HIV: Non-reactive (04/23 0000)  GBS:  neg  Assessment/Plan: This is a 32yo [id number] @ 39 0/7 by 9wk TVUS presenting for scheduled ERLTCS plus left salpingectomy for h/o csx x1 with satisfied fertility. PNC as stated above. R/B/A of cesarean section discussed with patient. Alternative would be vaginal delivery which would mean shorter postpartum stay and decreased risk of bleeding. Risks of section include infection of the uterus, pelvic organs, or skin, inadvertent injury to internal organs, such as bowel or bladder. If there is major injury, extensive surgery may be required. If injury is minor, it may be treated with relative ease. Discussed possibility of excessive blood loss and transfusion. If bleeding cannot be controlled using medical or minor  surgical methods, a cesarean hysterectomy may be performed which would mean no future fertility. Patient accepts the possibility of blood transfusion, if necessary. Patient understands and agrees to move forward with section. Given starting H/H, reviewed postop IV iron with patient who is in agreement. Additionally, has h/o allergy to adhesive tapes. Was able to avoid Prevena dressing after last section and is declining dressing today. Aware of increased SSI risks given BMI and location of incision. Will keep Honeycomb dressing in place until outpatient incision check.    Valerie Roys Agustus Mane 03/12/2023, 8:50 AM

## 2023-03-12 NOTE — Anesthesia Postprocedure Evaluation (Signed)
Anesthesia Post Note  Patient: Jensine Waterhouse  Procedure(s) Performed: CESAREAN SECTION WITH BILATERAL TUBAL LIGATION (Bilateral)     Patient location during evaluation: PACU Anesthesia Type: Spinal Level of consciousness: awake, awake and alert and oriented Pain management: pain level controlled Vital Signs Assessment: post-procedure vital signs reviewed and stable Respiratory status: spontaneous breathing, nonlabored ventilation and respiratory function stable Cardiovascular status: blood pressure returned to baseline and stable Postop Assessment: no headache, no backache, spinal receding and no apparent nausea or vomiting Anesthetic complications: no   No notable events documented.  Last Vitals:  Vitals:   03/12/23 1415 03/12/23 1500  BP: 129/70 (!) 149/76  Pulse: 68 68  Resp: 13 16  Temp: 37.1 C 36.8 C  SpO2: 96% 98%    Last Pain:  Vitals:   03/12/23 1500  TempSrc: Oral  PainSc: 5    Pain Goal: Patients Stated Pain Goal: 4 (03/12/23 1400)                 Collene Schlichter

## 2023-03-12 NOTE — Progress Notes (Signed)
Patient Pre-op CBG is 75. Patient feels nausea, sweating and stated 75 is too low for her.  MDA wants patient to receive  12.5 g of Dextrose

## 2023-03-13 ENCOUNTER — Encounter (HOSPITAL_COMMUNITY): Payer: Self-pay | Admitting: Obstetrics and Gynecology

## 2023-03-13 LAB — CBC
HCT: 24.7 % — ABNORMAL LOW (ref 36.0–46.0)
Hemoglobin: 7.8 g/dL — ABNORMAL LOW (ref 12.0–15.0)
MCH: 25.6 pg — ABNORMAL LOW (ref 26.0–34.0)
MCHC: 31.6 g/dL (ref 30.0–36.0)
MCV: 81 fL (ref 80.0–100.0)
Platelets: 146 10*3/uL — ABNORMAL LOW (ref 150–400)
RBC: 3.05 MIL/uL — ABNORMAL LOW (ref 3.87–5.11)
RDW: 14.4 % (ref 11.5–15.5)
WBC: 7.1 10*3/uL (ref 4.0–10.5)
nRBC: 0 % (ref 0.0–0.2)

## 2023-03-13 LAB — PREPARE RBC (CROSSMATCH)

## 2023-03-13 MED ORDER — ACETAMINOPHEN 325 MG PO TABS: 650.0000 mg | ORAL_TABLET | Freq: Once | ORAL | Status: DC

## 2023-03-13 MED ORDER — DIPHENHYDRAMINE HCL 25 MG PO CAPS
25.0000 mg | ORAL_CAPSULE | Freq: Once | ORAL | Status: AC
  Administered 2023-03-13: 25 mg via ORAL
  Filled 2023-03-13: qty 1

## 2023-03-13 MED ORDER — SODIUM CHLORIDE 0.9% IV SOLUTION: Freq: Once | INTRAVENOUS | Status: DC

## 2023-03-13 NOTE — Lactation Note (Signed)
This note was copied from a baby's chart. Lactation Consultation Note  Patient Name: Sheryl Velasquez [id number] Date: 03/13/2023 Age:31 hours Reason for consult: Follow-up assessment;Term;Maternal endocrine disorder  P3- MOB reports that infant is still nursing well and not causing any pain or discomfort. MOB denied having any questions or concerns at this time. LC encouraged MOB to call for further assistance as needed.  Maternal Data Does the patient have breastfeeding experience prior to this delivery?: Yes  Feeding Mother's Current Feeding Choice: Breast Milk  Lactation Tools Discussed/Used Pump Education: Milk Storage  Interventions Interventions: Breast feeding basics reviewed;Education;LC Services brochure  Discharge Discharge Education: Warning signs for feeding baby Pump: DEBP;Hands Free;Personal  Consult Status Consult Status: Follow-up Date: 03/14/23 Follow-up type: In-patient    Dema Severin BS, IBCLC 03/13/2023, 10:00 PM

## 2023-03-13 NOTE — Progress Notes (Signed)
CSW received consult for hx of Anxiety, Depression and Edinburgh score of 10.  CSW met with MOB to offer support and complete assessment. CSW entered the room, introduced herself and acknowledged that FOB was present. MOB gave CSW verbal permission to speak about anything while FOB was present. CSW explained her role and the reason for the visit. MOB was polite, easy to engage, receptive to meeting with CSW, and appeared forthcoming.  CSW acknowledged [place] score of 10 and listened to MOB explore her feelings about transitioning into motherhood. MOB reported her pregnancy was difficult and was put on bedrest. MOB reported being on bedrest was hard due to her being a "on the go kind of person". Patient declines a referral to [employer]. Patient verbalizes understanding that the appointment will be virtual. CSW inquired about MOB's mental health history. MOB reported being diagnosed with anxiety and depression in 2022. 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 MOB reported her supports as FOB, FOB's family and her family. MOB reported currently feeling good and bonded well with the infant. CSW provided education regarding the baby blues period vs. perinatal mood disorders, discussed treatment and gave resources for mental health follow up if concerns arise.  CSW recommends self-evaluation during the postpartum time period using the New Mom Checklist from Postpartum Progress and encouraged MOB to contact a medical professional if symptoms are noted at any time.  CSW assessed for safety with MOB SI and HI; MOB denied all. CSW did not assess for DV; FOB was present.   MOB reported having all essential items for the infant including a carseat, bassinet  and crib for safe sleeping. CSW asked MOB has she selected a pediatrician for the infant's follow up visits; MOB said [facility] [place]. CSW provided review of Sudden Infant Death Syndrome (SIDS) precautions.     CSW identifies no further need for intervention and no barriers to discharge at this time.  Enos Fling, Theresia Majors Clinical Social Worker [phone]

## 2023-03-13 NOTE — Progress Notes (Signed)
Subjective: Postpartum/Post-op Day 1: Cesarean Delivery Patient reports tolerating PO, + flatus, and no problems voiding.  Feeling very tired. Breastfeeding. Pain is well controlled. Denies any other symptoms.  Objective: Vital signs in last 24 hours: Temp:  [98 F (36.7 C)-98.7 F (37.1 C)] 98.6 F (37 C) (11/19 0500) Pulse Rate:  [68-88] 86 (11/19 0500) Resp:  [13-20] 18 (11/19 0500) BP: (108-149)/(57-77) 121/67 (11/19 0500) SpO2:  [96 %-100 %] 98 % (11/18 1948) Weight:  [125.2 kg] 125.2 kg (11/18 0908)  Physical Exam:  General: alert, cooperative, no distress, and pale Lochia: appropriate Uterine Fundus: firm Incision: no significant drainage, no significant erythema, honeycomb dressing in place DVT Evaluation: No evidence of DVT seen on physical exam.  Recent Labs    03/12/23 1708 03/13/23 0606  HGB 8.4* 7.8*  HCT 27.3* 24.7*    Assessment/Plan: Status post Cesarean section. Postoperative course complicated by severe symptomatic acute blood loss anemia: 1u PRBC ordered followed by H+H.   cHTN: normotensive to mild range. Asx. Admit Cr and platelets wnl. Labetalol discontinued postpartum, continue Procardia 30mg  daily. Monitor Bps. gDMA2: h/o PCOS, baby conceived on PO metformin, discontinued on admission Obesity: ppx Lovenox Desires circumcision for baby boy. Discussed r/b/a with patient, desires to move forward. Please see documentation in baby boy's chart.  Continue current care.  Willa Frater, MD 03/13/2023, 7:35 AM

## 2023-03-14 LAB — TYPE AND SCREEN
ABO/RH(D): A POS
Antibody Screen: NEGATIVE
Unit division: 0

## 2023-03-14 LAB — BPAM RBC
Blood Product Expiration Date: 202411262359
ISSUE DATE / TIME: 202411191113
Unit Type and Rh: 600

## 2023-03-14 LAB — HEMOGLOBIN AND HEMATOCRIT, BLOOD
HCT: 27.7 % — ABNORMAL LOW (ref 36.0–46.0)
Hemoglobin: 8.7 g/dL — ABNORMAL LOW (ref 12.0–15.0)

## 2023-03-14 LAB — SURGICAL PATHOLOGY

## 2023-03-14 MED ORDER — FERROUS SULFATE 325 (65 FE) MG PO TABS
325.0000 mg | ORAL_TABLET | Freq: Every day | ORAL | Status: DC
  Administered 2023-03-14: 325 mg via ORAL
  Filled 2023-03-14: qty 1

## 2023-03-14 MED ORDER — IBUPROFEN 600 MG PO TABS: 600.0000 mg | ORAL_TABLET | Freq: Four times a day (QID) | ORAL | 1 refills | Status: AC | PRN

## 2023-03-14 MED ORDER — OXYCODONE-ACETAMINOPHEN 5-325 MG PO TABS: 1.0000 | ORAL_TABLET | ORAL | 0 refills | Status: AC | PRN

## 2023-03-14 MED ORDER — FERROUS SULFATE 325 (65 FE) MG PO TABS: 325.0000 mg | ORAL_TABLET | ORAL | 3 refills | Status: AC

## 2023-03-14 NOTE — Discharge Summary (Signed)
Postpartum Discharge Summary  Date of Service updated      Patient Name: Sheryl Velasquez DOB: 02-13-1991 MRN: [id number]  Date of admission: 03/12/2023 Delivery date:03/12/2023 Delivering provider: Carlisle Cater Date of discharge: 03/14/2023  Admitting diagnosis: History of cesarean section [Z98.891] Intrauterine pregnancy: [redacted]w[redacted]d     Secondary diagnosis:  Principal Problem:   History of cesarean section  Additional problems:  chtn, GDMA2, polyhydrtamnios, Breech, maternal obesity     Discharge diagnosis: Term Pregnancy Delivered, CHTN, GDM A2, and Anemia- of acute blood loss not clinically significant                                          Post partum procedures: n/a Augmentation: N/A Complications: None  Hospital course: Sceduled C/S   32 y.o. yo [id number] at [redacted]w[redacted]d was admitted to the hospital 03/12/2023 for scheduled cesarean section with the following indication:Elective Repeat.Delivery details are as follows:  Membrane Rupture Time/Date: 11:57 AM,03/12/2023  Delivery Method:C-Section, Vacuum Assisted Operative Delivery:Device used:vacuum Indication: Fetal indications Details of operation can be found in separate operative note.  Patient had a postpartum course complicated by n/a.  She is ambulating, tolerating a regular diet, passing flatus, and urinating well. Patient is discharged home in stable condition on  03/14/23        Newborn Data: Birth date:03/12/2023 Birth time:12:00 PM Gender:Female Living status:Living Apgars:9 ,9  Weight:3970 g    Magnesium Sulfate received: No BMZ received: No Rhophylac:N/A MMR: T-DaP:Given prenatally Flu: No RSV Vaccine received: No Transfusion:No Immunizations administered: There is no immunization history for the selected administration types on file for this patient.  Physical exam  Vitals:   03/13/23 1142 03/13/23 1348 03/13/23 1927 03/14/23 0523  BP: 132/78 127/77 133/71 136/78  Pulse: 88 97 89 86  Resp: 18 18  18 18   Temp: 98.4 F (36.9 C) 98.4 F (36.9 C) 98.9 F (37.2 C) 98.1 F (36.7 C)  TempSrc: Oral Oral Oral Oral  SpO2: 99%  99% 99%  Weight:      Height:       General: alert, cooperative, and no distress Lochia: appropriate Uterine Fundus: firm Incision: Dressing is clean, dry, and intact DVT Evaluation: No evidence of DVT seen on physical exam. Labs: Lab Results  Component Value Date   WBC 7.1 03/13/2023   HGB 8.7 (L) 03/14/2023   HCT 27.7 (L) 03/14/2023   MCV 81.0 03/13/2023   PLT 146 (L) 03/13/2023      Latest Ref Rng & Units 03/12/2023    5:08 PM  CMP  Creatinine 0.44 - 1.00 mg/dL 9.62    Edinburgh Score:    03/12/2023    4:00 PM  Edinburgh Postnatal Depression Scale Screening Tool  I have been able to laugh and see the funny side of things. 0  I have looked forward with enjoyment to things. 1  I have blamed myself unnecessarily when things went wrong. 1  I have been anxious or worried for no good reason. 3  I have felt scared or panicky for no good reason. 2  Things have been getting on top of me. 2  I have been so unhappy that I have had difficulty sleeping. 1  I have felt sad or miserable. 0  I have been so unhappy that I have been crying. 0  The thought of harming myself has occurred to me. 0  Edinburgh Postnatal Depression Scale Total 10      After visit meds:  Allergies as of 03/14/2023       Reactions   Tape    Tears skin         Medication List     STOP taking these medications    aspirin EC 81 MG tablet   famotidine 20 MG tablet Commonly known as: PEPCID   labetalol 200 MG tablet Commonly known as: NORMODYNE   metFORMIN 500 MG 24 hr tablet Commonly known as: GLUCOPHAGE-XR   metFORMIN 750 MG 24 hr tablet Commonly known as: GLUCOPHAGE-XR       TAKE these medications    ferrous sulfate 325 (65 FE) MG tablet Take 1 tablet (325 mg total) by mouth every other day.   folic acid 800 MCG tablet Commonly known as:  FOLVITE Take 800 mcg by mouth daily.   ibuprofen 600 MG tablet Commonly known as: ADVIL Take 1 tablet (600 mg total) by mouth every 6 (six) hours as needed for moderate pain (pain score 4-6) or cramping.   Magnesium 500 MG Caps Take 500 mg by mouth at bedtime.   NIFEdipine 30 MG 24 hr tablet Commonly known as: PROCARDIA-XL/NIFEDICAL-XL Take 1 tablet (30 mg total) by mouth daily. What changed: when to take this   oxyCODONE-acetaminophen 5-325 MG tablet Commonly known as: Percocet Take 1 tablet by mouth every 4 (four) hours as needed for up to 7 days for severe pain (pain score 7-10).               Discharge Care Instructions  (From admission, onward)           Start     Ordered   03/14/23 0000  Discharge wound care:       Comments: Remove dressing in 5 days if still in place. May shower, pat dry. Keep incision clean   03/14/23 1404             Discharge home in stable condition Infant Feeding: Breast Infant Disposition:home with mother Discharge instruction: per After Visit Summary and Postpartum booklet. Activity: Advance as tolerated. Pelvic rest for 6 weeks.  Diet: carb modified diet and low salt diet Anticipated Birth Control:  sterilization done  Postpartum Appointment:6 weeks Additional Postpartum F/U: Postpartum Depression checkup, 2 hour GTT, Incision check 1 week, and BP check 1 week Future Appointments:No future appointments. Follow up Visit:  Follow-up Information     [facility]. Schedule an appointment as soon as possible for a visit.   Why: 1 week for incision check and 6 weeks for postpartum visit Contact information: [address]  [address] [place] [address] [phone]                     03/14/2023 Cathrine Muster, DO

## 2023-03-14 NOTE — Progress Notes (Signed)
Subjective: Postpartum Day 2: Cesarean Delivery Patient reports tolerating PO, + flatus, + BM, and no problems voiding.  Incisional pain better controlled now with pain medication. Lochia mild. She denies CP, SOB or HA. She is bonding well with baby - cluster feeding now. She feels ready for discharge to home if baby cleared  Objective: Vital signs in last 24 hours: Temp:  [98.1 F (36.7 C)-98.9 F (37.2 C)] 98.1 F (36.7 C) (11/20 0523) Pulse Rate:  [86-97] 86 (11/20 0523) Resp:  [18] 18 (11/20 0523) BP: (127-136)/(71-78) 136/78 (11/20 0523) SpO2:  [99 %] 99 % (11/20 0523)  Physical Exam:  General: alert, cooperative, and no distress Lochia: appropriate Uterine Fundus: firm Incision: no significant drainage DVT Evaluation: No evidence of DVT seen on physical exam. No significant calf/ankle edema.  Recent Labs    03/13/23 0606 03/14/23 0841  HGB 7.8* 8.7*  HCT 24.7* 27.7*    Assessment/Plan: Status post Cesarean section. Doing well postoperatively.  Continue current care Discharge to home later today if baby cleared.  BP stable - continue on procardia  Pain well controlled. Rx for percocet and ibuprofen will be sent to pharmacy Iron supps adviced.  Cathrine Muster, DO 03/14/2023, 11:27 AM

## 2023-03-14 NOTE — Discharge Instructions (Signed)
Call office with any concerns [phone] 

## 2023-03-14 NOTE — Progress Notes (Signed)
H&H not drawn yesterday post blood transfusion as per nursing order. Called Dr. Mindi Slicker to verify that H&H still needed to be drawn. Dr. Mindi Slicker ordered H&H for now. Earl Gala, Linda Hedges Deshler

## 2023-03-21 ENCOUNTER — Telehealth (HOSPITAL_COMMUNITY): Payer: Self-pay | Admitting: *Deleted

## 2023-03-21 NOTE — Telephone Encounter (Signed)
03/21/2023  Name: Sheryl Velasquez MRN: [id number] DOB: April 30, 1990  Reason for Call:  Transition of Care Hospital Discharge Call  Contact Status: Patient Contact Status: Message  Language assistant needed:          Follow-Up Questions:    Inocente Salles Postnatal Depression Scale:  In the Past 7 Days:    PHQ2-9 Depression Scale:     Discharge Follow-up:    Post-discharge interventions: NA  Salena Saner, RN 03/21/2023  15:56

## 2023-05-09 ENCOUNTER — Other Ambulatory Visit: Payer: Self-pay | Admitting: Cardiology

## 2024-01-27 ENCOUNTER — Encounter (HOSPITAL_BASED_OUTPATIENT_CLINIC_OR_DEPARTMENT_OTHER): Payer: Self-pay | Admitting: Emergency Medicine

## 2024-01-27 ENCOUNTER — Other Ambulatory Visit: Payer: Self-pay

## 2024-01-27 DIAGNOSIS — I10 Essential (primary) hypertension: Secondary | ICD-10-CM | POA: Insufficient documentation

## 2024-01-27 DIAGNOSIS — R519 Headache, unspecified: Secondary | ICD-10-CM | POA: Insufficient documentation

## 2024-01-27 DIAGNOSIS — R030 Elevated blood-pressure reading, without diagnosis of hypertension: Secondary | ICD-10-CM | POA: Diagnosis present

## 2024-01-27 DIAGNOSIS — Z794 Long term (current) use of insulin: Secondary | ICD-10-CM | POA: Diagnosis not present

## 2024-01-27 NOTE — ED Triage Notes (Signed)
 Pt via POV c/o high BP readings at home, 168/120 and 180/128 on home monitor. BP in triage 163/104. Pt took labetalol  100mg  PTA, ordered twice daily and pt states she is compliant. Pt reports anterior headache rated 7/10.

## 2024-01-28 ENCOUNTER — Emergency Department (HOSPITAL_BASED_OUTPATIENT_CLINIC_OR_DEPARTMENT_OTHER)
Admission: EM | Admit: 2024-01-28 | Discharge: 2024-01-28 | Disposition: A | Discharge status: Home / Self Care | Payer: Self-pay | Attending: Emergency Medicine | Admitting: Emergency Medicine

## 2024-01-28 ENCOUNTER — Emergency Department (HOSPITAL_BASED_OUTPATIENT_CLINIC_OR_DEPARTMENT_OTHER)

## 2024-01-28 DIAGNOSIS — I10 Essential (primary) hypertension: Secondary | ICD-10-CM

## 2024-01-28 DIAGNOSIS — R519 Headache, unspecified: Secondary | ICD-10-CM

## 2024-01-28 LAB — COMPREHENSIVE METABOLIC PANEL WITH GFR
ALT: 41 U/L (ref 0–44)
AST: 24 U/L (ref 15–41)
Albumin: 3.9 g/dL (ref 3.5–5.0)
Alkaline Phosphatase: 90 U/L (ref 38–126)
Anion gap: 11 (ref 5–15)
BUN: 12 mg/dL (ref 6–20)
CO2: 23 mmol/L (ref 22–32)
Calcium: 8.8 mg/dL — ABNORMAL LOW (ref 8.9–10.3)
Chloride: 103 mmol/L (ref 98–111)
Creatinine, Ser: 0.57 mg/dL (ref 0.44–1.00)
GFR, Estimated: >60 mL/min (ref >60)
Glucose, Bld: 137 mg/dL — ABNORMAL HIGH (ref 70–99)
Potassium: 3.9 mmol/L (ref 3.5–5.1)
Sodium: 138 mmol/L (ref 135–145)
Total Bilirubin: 0.4 mg/dL (ref 0.0–1.2)
Total Protein: 6.2 g/dL — ABNORMAL LOW (ref 6.5–8.1)

## 2024-01-28 LAB — CBC WITH DIFFERENTIAL/PLATELET
Abs Immature Granulocytes: 0.02 K/uL (ref 0.00–0.07)
Basophils Absolute: 0.1 K/uL (ref 0.0–0.1)
Basophils Relative: 1 %
Eosinophils Absolute: 0.2 K/uL (ref 0.0–0.5)
Eosinophils Relative: 2 %
HCT: 35.1 % — ABNORMAL LOW (ref 36.0–46.0)
Hemoglobin: 12 g/dL (ref 12.0–15.0)
Immature Granulocytes: 0 %
Lymphocytes Relative: 39 %
Lymphs Abs: 2.7 K/uL (ref 0.7–4.0)
MCH: 29.1 pg (ref 26.0–34.0)
MCHC: 34.2 g/dL (ref 30.0–36.0)
MCV: 85 fL (ref 80.0–100.0)
Monocytes Absolute: 0.5 K/uL (ref 0.1–1.0)
Monocytes Relative: 8 %
Neutro Abs: 3.3 K/uL (ref 1.7–7.7)
Neutrophils Relative %: 50 %
Platelets: 224 K/uL (ref 150–400)
RBC: 4.13 MIL/uL (ref 3.87–5.11)
RDW: 12.8 % (ref 11.5–15.5)
WBC: 6.8 K/uL (ref 4.0–10.5)
nRBC: 0 % (ref 0.0–0.2)

## 2024-01-28 LAB — PRO BRAIN NATRIURETIC PEPTIDE: Pro Brain Natriuretic Peptide: 88 pg/mL (ref <300.0)

## 2024-01-28 LAB — MAGNESIUM: Magnesium: 1.9 mg/dL (ref 1.7–2.4)

## 2024-01-28 MED ORDER — NAPROXEN 250 MG PO TABS
500.0000 mg | ORAL_TABLET | Freq: Once | ORAL | Status: AC
  Administered 2024-01-28: 500 mg via ORAL
  Filled 2024-01-28: qty 2

## 2024-01-28 NOTE — ED Provider Notes (Signed)
 [facility] Provider Note   CSN: [id number] Arrival date & time: 01/27/24  2337     Patient presents with: Hypertension   Sheryl Velasquez is a 33 y.o. female.   The patient  presents with concerns of elevated blood pressure and associated symptoms. She reports a history of blood pressure issues and was recently restarted on labetalol  due to breastfeeding her two-month-old baby. The patient describes feeling weird, with symptoms including dizziness, tingling in her fingers and feet, and a severe headache. She initially attributed the headache to her menstrual cycle, which recently resumed, but the symptoms worsened. Upon checking her blood pressure at home, she noted readings of 160/128 mmHg and later 181/120 mmHg, prompting her to seek medical attention. The patient has a history of high blood pressure during her pregnancies and is currently experiencing a throbbing headache with visual disturbances. She is also dealing with anemia and has been prescribed a high dose of vitamins. Additionally, she has a history of PCOS and is on metformin  for insulin regulation. She has not taken any medication for her headache due to liver concerns regarding acetaminophen  and a lack of ibuprofen . The history was obtained from the patient and husband.   Hypertension       Prior to Admission medications   Medication Sig Start Date End Date Taking? Authorizing Provider  ferrous sulfate  325 (65 FE) MG tablet Take 1 tablet (325 mg total) by mouth every other day. 03/14/23   Delana Ted Morrison, DO  folic acid (FOLVITE) 800 MCG tablet Take 800 mcg by mouth daily.    [provider]  ibuprofen  (ADVIL ) 600 MG tablet Take 1 tablet (600 mg total) by mouth every 6 (six) hours as needed for moderate pain (pain score 4-6) or cramping. 03/14/23   Delana Ted Morrison, DO  Magnesium 500 MG CAPS Take 500 mg by mouth at bedtime.    [provider]   NIFEdipine  (PROCARDIA -XL/NIFEDICAL-XL) 30 MG 24 hr tablet TAKE 1 TABLET BY MOUTH EVERY DAY 05/09/23   Tobb, Kardie, DO    Allergies: Tape    Review of Systems  Updated Vital Signs BP 127/73   Pulse 99   Temp 98.1 F (36.7 C) (Oral)   Resp 18   Ht 5' 2 (1.575 m)   Wt 127 kg   LMP 01/26/2024 (Exact Date)   SpO2 100%   Breastfeeding No   BMI 51.21 kg/m   Physical Exam Vitals and nursing note reviewed.  Constitutional:      Appearance: She is well-developed.  HENT:     Head: Normocephalic and atraumatic.  Cardiovascular:     Rate and Rhythm: Normal rate and regular rhythm.  Pulmonary:     Effort: No respiratory distress.     Breath sounds: No stridor.  Abdominal:     General: There is no distension.  Musculoskeletal:        General: No swelling or tenderness. Normal range of motion.     Cervical back: Normal range of motion.  Skin:    General: Skin is warm and dry.  Neurological:     Mental Status: She is alert.     Comments: No altered mental status, able to give full seemingly accurate history.  Face is symmetric, EOM's intact, pupils equal and reactive, vision intact, tongue and uvula midline without deviation. Upper and Lower extremity motor 5/5, intact pain perception in distal extremities, 2+ reflexes in biceps, patella and achilles tendons. Able to perform  finger to nose normal with both hands. Walks without assistance or evident ataxia.       (all labs ordered are listed, but only abnormal results are displayed) Labs Reviewed  CBC WITH DIFFERENTIAL/PLATELET - Abnormal; Notable for the following components:      Result Value   HCT 35.1 (*)    All other components within normal limits  COMPREHENSIVE METABOLIC PANEL WITH GFR - Abnormal; Notable for the following components:   Glucose, Bld 137 (*)    Calcium  8.8 (*)    Total Protein 6.2 (*)    All other components within normal limits  MAGNESIUM  PRO BRAIN NATRIURETIC PEPTIDE     EKG: None  Radiology: CT Head Wo Contrast Result Date: 01/28/2024 EXAM: CT HEAD WITHOUT CONTRAST 01/28/2024 02:24:00 AM TECHNIQUE: CT of the head was performed without the administration of intravenous contrast. Automated exposure control, iterative reconstruction, and/or weight based adjustment of the mA/kV was utilized to reduce the radiation dose to as low as reasonably achievable. COMPARISON: None available. CLINICAL HISTORY: Headache, increasing frequency or severity. Pt via POV c/o high BP readings at home, 168/120 and 180/128 on home monitor. BP in triage 163/104. Pt took labetalol  100mg  PTA, ordered twice daily and pt states she is compliant. Pt reports anterior headache rated 7/10. FINDINGS: BRAIN AND VENTRICLES: No acute hemorrhage. No evidence of acute infarct. No hydrocephalus. No extra-axial collection. No mass effect or midline shift. ORBITS: No acute abnormality. SINUSES: No acute abnormality. SOFT TISSUES AND SKULL: No acute soft tissue abnormality. No skull fracture. IMPRESSION: 1. No acute intracranial abnormality. Electronically signed by: Franky Stanford MD 01/28/2024 02:34 AM EDT RP Workstation: [id number]     Procedures   Medications Ordered in the ED  naproxen (NAPROSYN) tablet 500 mg (500 mg Oral Given 01/28/24 0208)                                    Medical Decision Making Amount and/or Complexity of Data Reviewed Labs: ordered. Radiology: ordered.  Risk Prescription drug management.   Secondary to headache and hypertension CT scan done to evaluate for bleed or stroke and this was negative.  She has no other neurologic symptoms especially in the be concerning for stroke so no further workup needed there.  Calcium  slightly low although key.  With her baseline.  Her history of anemia seems to have improved with a hemoglobin of 12 today.  BMP were negative.  Rest of her electrolytes were reassuring, low suspicion for those to be the cause of her symptoms.  She will  continue using medications at home and follow-up with her PCP for further workup and management low suspicion for hypertensive crisis at this time.  BP improved prior to my evaluation and persistently stayed normal.  Glucose was slightly high but she is currently on metformin  and her doctor is following this.     Final diagnoses:  Hypertension, unspecified type  Nonintractable headache, unspecified chronicity pattern, unspecified headache type    ED Discharge Orders     None          Sole Lengacher, Selinda, MD 01/28/24 [phone]
